# Patient Record
Sex: Female | Born: 1965 | Race: White | Hispanic: No
Health system: Southern US, Community
[De-identification: ages and names within clinical notes are randomized; demographics above are authoritative.]

## PROBLEM LIST (undated history)

## (undated) DIAGNOSIS — F32A Depression, unspecified: Secondary | ICD-10-CM

## (undated) DIAGNOSIS — K219 Gastro-esophageal reflux disease without esophagitis: Secondary | ICD-10-CM

## (undated) DIAGNOSIS — Z8619 Personal history of other infectious and parasitic diseases: Secondary | ICD-10-CM

## (undated) DIAGNOSIS — E559 Vitamin D deficiency, unspecified: Secondary | ICD-10-CM

## (undated) DIAGNOSIS — O24419 Gestational diabetes mellitus in pregnancy, unspecified control: Secondary | ICD-10-CM

## (undated) DIAGNOSIS — D649 Anemia, unspecified: Secondary | ICD-10-CM

## (undated) DIAGNOSIS — F329 Major depressive disorder, single episode, unspecified: Secondary | ICD-10-CM

## (undated) DIAGNOSIS — F419 Anxiety disorder, unspecified: Secondary | ICD-10-CM

## (undated) HISTORY — DX: Depression, unspecified: F41.9

## (undated) HISTORY — DX: Gastro-esophageal reflux disease without esophagitis: K21.9

## (undated) HISTORY — DX: Anemia, unspecified: D64.9

## (undated) HISTORY — DX: Major depressive disorder, single episode, unspecified: F32.9

## (undated) HISTORY — DX: Vitamin D deficiency, unspecified: E55.9

## (undated) HISTORY — DX: Personal history of other infectious and parasitic diseases: Z86.19

## (undated) HISTORY — PX: HAND SURGERY: SHX662

## (undated) HISTORY — DX: Depression, unspecified: F32.A

## (undated) HISTORY — DX: Gestational diabetes mellitus in pregnancy, unspecified control: O24.419

---

## 1990-09-28 HISTORY — PX: GYNECOLOGIC CRYOSURGERY: SHX857

## 1994-11-27 HISTORY — PX: CHOLECYSTECTOMY: SHX55

## 1998-09-28 HISTORY — PX: PELVIC LAPAROSCOPY: SHX162

## 1998-11-04 ENCOUNTER — Ambulatory Visit (HOSPITAL_COMMUNITY): Admission: RE | Admit: 1998-11-04 | Discharge: 1998-11-04 | Payer: Self-pay | Admitting: Obstetrics and Gynecology

## 1999-09-22 ENCOUNTER — Emergency Department (HOSPITAL_COMMUNITY): Admission: EM | Admit: 1999-09-22 | Discharge: 1999-09-22 | Payer: Self-pay | Admitting: Emergency Medicine

## 1999-09-30 ENCOUNTER — Other Ambulatory Visit: Admission: RE | Admit: 1999-09-30 | Discharge: 1999-09-30 | Payer: Self-pay | Admitting: Obstetrics and Gynecology

## 2000-10-06 ENCOUNTER — Other Ambulatory Visit: Admission: RE | Admit: 2000-10-06 | Discharge: 2000-10-06 | Payer: Self-pay | Admitting: Obstetrics and Gynecology

## 2001-08-24 ENCOUNTER — Other Ambulatory Visit: Admission: RE | Admit: 2001-08-24 | Discharge: 2001-08-24 | Payer: Self-pay | Admitting: Gynecology

## 2002-01-06 ENCOUNTER — Encounter: Admission: RE | Admit: 2002-01-06 | Discharge: 2002-01-30 | Payer: Self-pay | Admitting: Gynecology

## 2002-03-29 ENCOUNTER — Inpatient Hospital Stay (HOSPITAL_COMMUNITY): Admission: AD | Admit: 2002-03-29 | Discharge: 2002-03-31 | Payer: Self-pay | Admitting: Gynecology

## 2002-05-08 ENCOUNTER — Other Ambulatory Visit: Admission: RE | Admit: 2002-05-08 | Discharge: 2002-05-08 | Payer: Self-pay | Admitting: Gynecology

## 2002-09-28 HISTORY — PX: TUBAL LIGATION: SHX77

## 2002-12-27 ENCOUNTER — Other Ambulatory Visit: Admission: RE | Admit: 2002-12-27 | Discharge: 2002-12-27 | Payer: Self-pay | Admitting: Gynecology

## 2003-03-27 ENCOUNTER — Encounter: Admission: RE | Admit: 2003-03-27 | Discharge: 2003-06-25 | Payer: Self-pay | Admitting: Gynecology

## 2003-07-12 ENCOUNTER — Inpatient Hospital Stay (HOSPITAL_COMMUNITY): Admission: AD | Admit: 2003-07-12 | Discharge: 2003-07-14 | Payer: Self-pay | Admitting: Gynecology

## 2003-07-13 ENCOUNTER — Encounter (INDEPENDENT_AMBULATORY_CARE_PROVIDER_SITE_OTHER): Payer: Self-pay | Admitting: *Deleted

## 2003-08-22 ENCOUNTER — Encounter: Admission: RE | Admit: 2003-08-22 | Discharge: 2003-08-22 | Payer: Self-pay | Admitting: Gynecology

## 2003-08-22 ENCOUNTER — Other Ambulatory Visit: Admission: RE | Admit: 2003-08-22 | Discharge: 2003-08-22 | Payer: Self-pay | Admitting: Gynecology

## 2003-09-06 ENCOUNTER — Encounter: Admission: RE | Admit: 2003-09-06 | Discharge: 2003-09-06 | Payer: Self-pay | Admitting: Neurology

## 2003-09-19 ENCOUNTER — Encounter: Admission: RE | Admit: 2003-09-19 | Discharge: 2003-09-19 | Payer: Self-pay | Admitting: Neurology

## 2003-09-22 ENCOUNTER — Emergency Department (HOSPITAL_COMMUNITY): Admission: EM | Admit: 2003-09-22 | Discharge: 2003-09-22 | Payer: Self-pay | Admitting: Emergency Medicine

## 2004-02-04 ENCOUNTER — Ambulatory Visit (HOSPITAL_COMMUNITY): Admission: RE | Admit: 2004-02-04 | Discharge: 2004-02-04 | Payer: Self-pay | Admitting: Neurology

## 2004-02-11 ENCOUNTER — Encounter: Admission: RE | Admit: 2004-02-11 | Discharge: 2004-02-11 | Payer: Self-pay | Admitting: Neurology

## 2004-09-03 ENCOUNTER — Other Ambulatory Visit: Admission: RE | Admit: 2004-09-03 | Discharge: 2004-09-03 | Payer: Self-pay | Admitting: Gynecology

## 2004-10-01 ENCOUNTER — Ambulatory Visit (HOSPITAL_COMMUNITY): Admission: RE | Admit: 2004-10-01 | Discharge: 2004-10-01 | Payer: Self-pay | Admitting: Gynecology

## 2004-12-22 ENCOUNTER — Encounter: Admission: RE | Admit: 2004-12-22 | Discharge: 2004-12-22 | Payer: Self-pay | Admitting: Family Medicine

## 2005-02-15 ENCOUNTER — Emergency Department (HOSPITAL_COMMUNITY): Admission: EM | Admit: 2005-02-15 | Discharge: 2005-02-15 | Payer: Self-pay | Admitting: Family Medicine

## 2005-06-23 ENCOUNTER — Ambulatory Visit (HOSPITAL_BASED_OUTPATIENT_CLINIC_OR_DEPARTMENT_OTHER): Admission: RE | Admit: 2005-06-23 | Discharge: 2005-06-23 | Payer: Self-pay | Admitting: Orthopedic Surgery

## 2005-06-23 ENCOUNTER — Ambulatory Visit (HOSPITAL_COMMUNITY): Admission: RE | Admit: 2005-06-23 | Discharge: 2005-06-23 | Payer: Self-pay | Admitting: Orthopedic Surgery

## 2005-09-04 ENCOUNTER — Other Ambulatory Visit: Admission: RE | Admit: 2005-09-04 | Discharge: 2005-09-04 | Payer: Self-pay | Admitting: Gynecology

## 2005-10-09 ENCOUNTER — Ambulatory Visit (HOSPITAL_COMMUNITY): Admission: RE | Admit: 2005-10-09 | Discharge: 2005-10-09 | Payer: Self-pay | Admitting: Gynecology

## 2006-01-08 IMAGING — CR DG FOREARM 2V*L*
2 series · 2 of 2 positions shown · non-contrast
Comparison: none

CLINICAL DATA: 38 year old who fell.
 TWO VIEW LEFT FOREARM:
 There is no evidence of fracture or dislocation.  No other significant bone or soft tissue abnormalities are identified.

[view not recorded (1 of 2)]
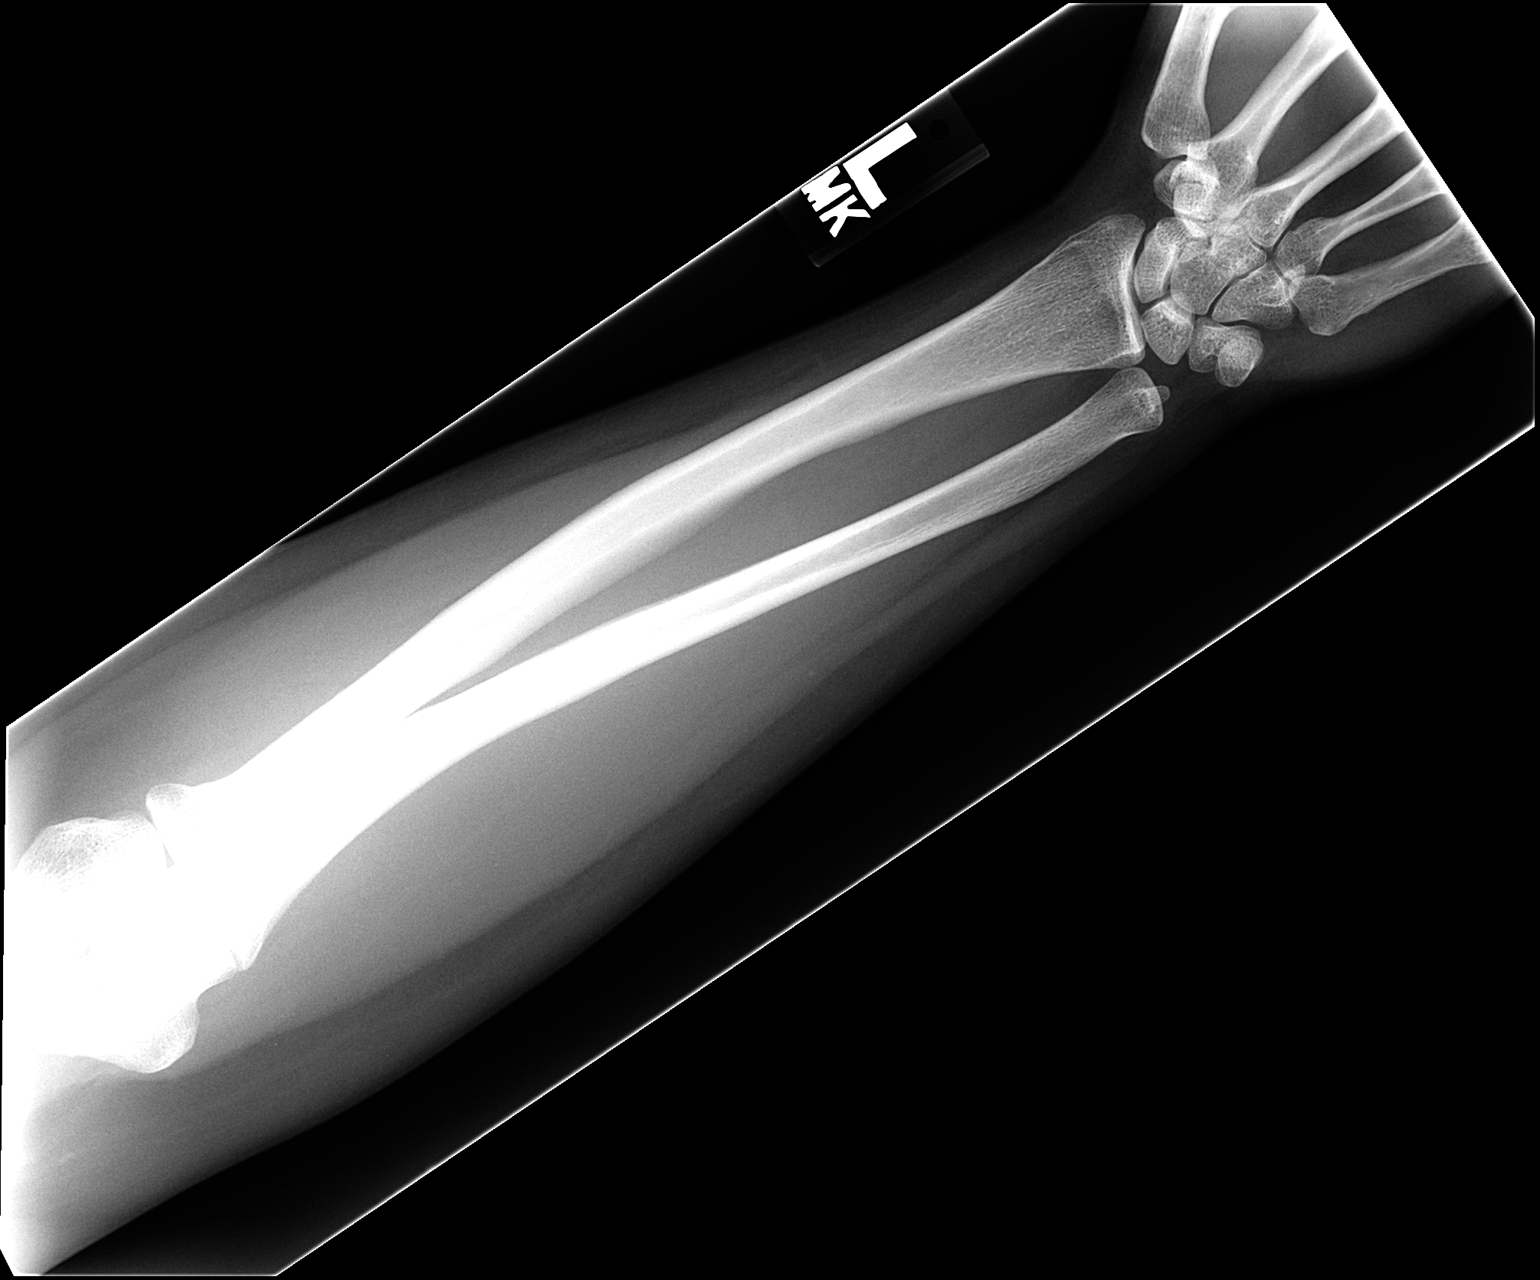

[view not recorded (2 of 2)]
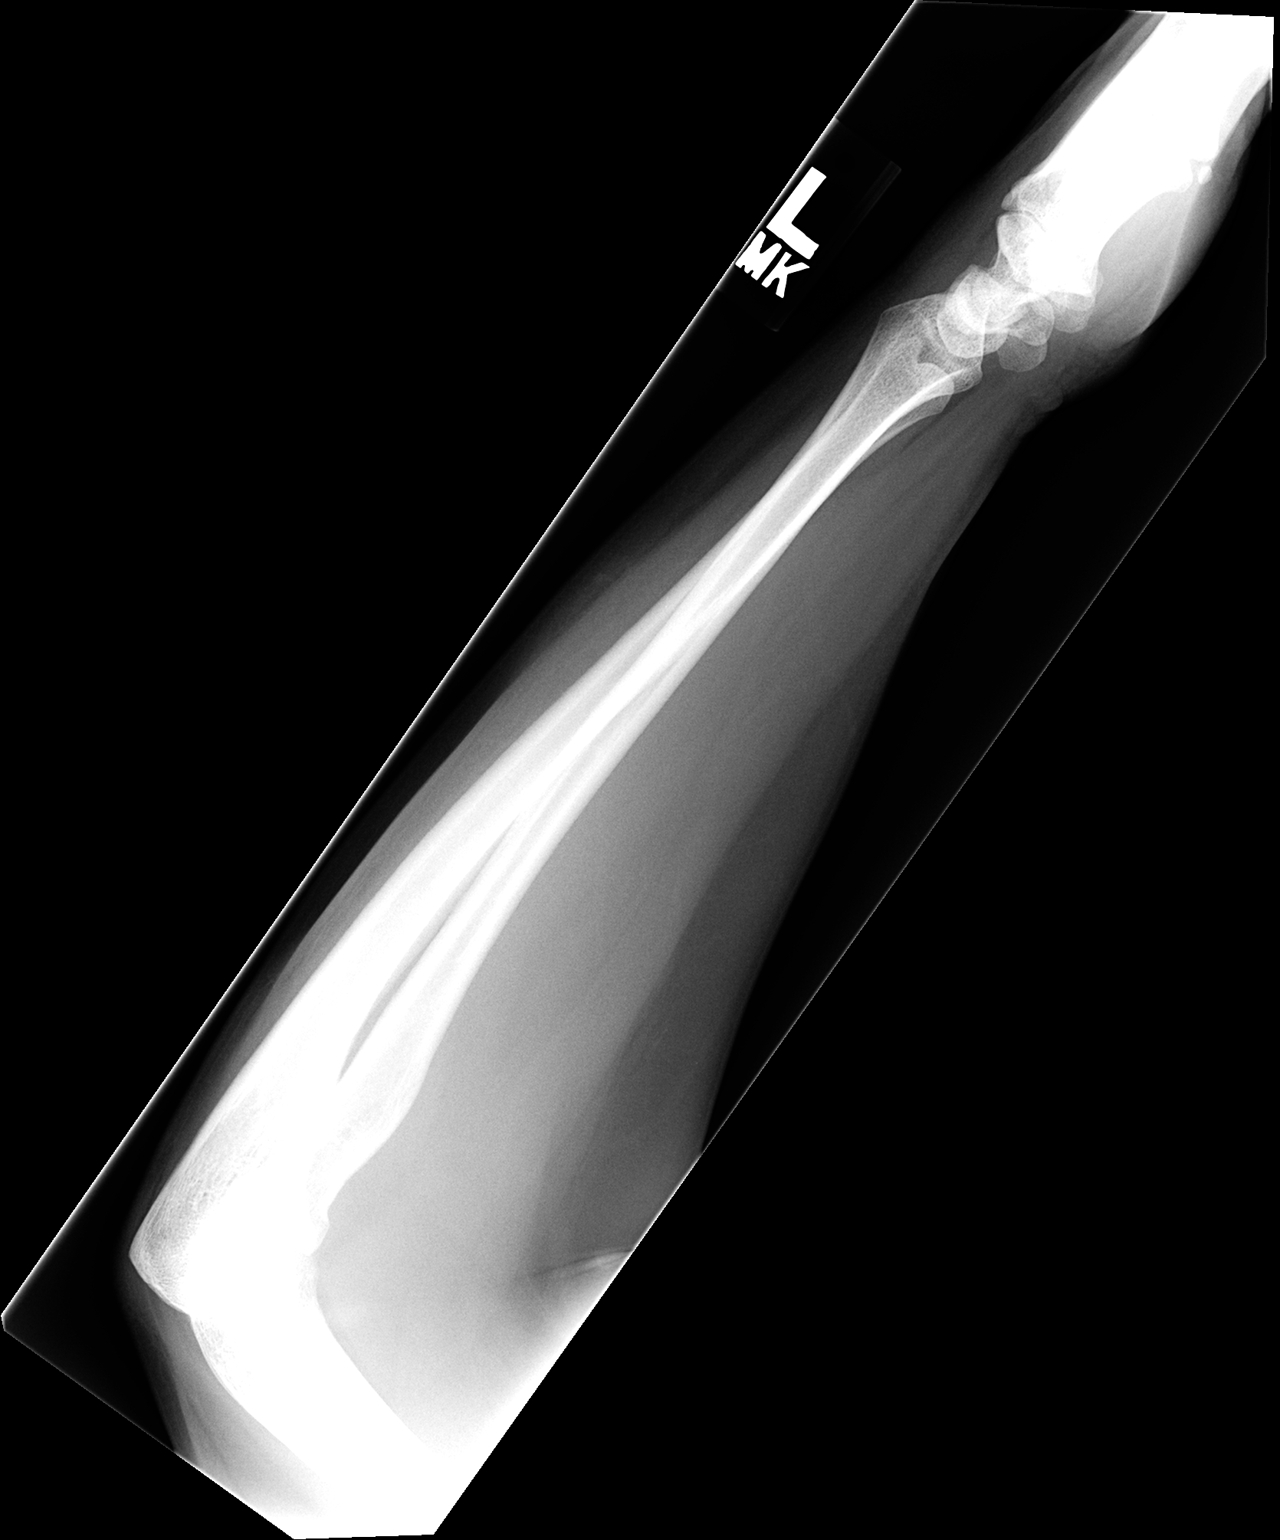

[2 of 2 positions shown; findings below may reference images not displayed]

IMPRESSION: Normal study.

## 2006-02-08 ENCOUNTER — Ambulatory Visit: Payer: Self-pay | Admitting: Internal Medicine

## 2006-09-08 ENCOUNTER — Other Ambulatory Visit: Admission: RE | Admit: 2006-09-08 | Discharge: 2006-09-08 | Payer: Self-pay | Admitting: Gynecology

## 2006-10-11 ENCOUNTER — Ambulatory Visit (HOSPITAL_COMMUNITY): Admission: RE | Admit: 2006-10-11 | Discharge: 2006-10-11 | Payer: Self-pay | Admitting: Gynecology

## 2007-10-13 ENCOUNTER — Ambulatory Visit (HOSPITAL_COMMUNITY): Admission: RE | Admit: 2007-10-13 | Discharge: 2007-10-13 | Payer: Self-pay | Admitting: Gynecology

## 2007-11-08 ENCOUNTER — Other Ambulatory Visit: Admission: RE | Admit: 2007-11-08 | Discharge: 2007-11-08 | Payer: Self-pay | Admitting: Gynecology

## 2007-11-16 DIAGNOSIS — Z8619 Personal history of other infectious and parasitic diseases: Secondary | ICD-10-CM

## 2007-11-16 HISTORY — DX: Personal history of other infectious and parasitic diseases: Z86.19

## 2008-02-27 ENCOUNTER — Other Ambulatory Visit: Admission: RE | Admit: 2008-02-27 | Discharge: 2008-02-27 | Payer: Self-pay | Admitting: Gynecology

## 2008-07-09 ENCOUNTER — Telehealth: Payer: Self-pay | Admitting: Internal Medicine

## 2008-07-09 DIAGNOSIS — R110 Nausea: Secondary | ICD-10-CM | POA: Insufficient documentation

## 2008-07-09 DIAGNOSIS — R1013 Epigastric pain: Secondary | ICD-10-CM | POA: Insufficient documentation

## 2008-07-09 DIAGNOSIS — K648 Other hemorrhoids: Secondary | ICD-10-CM | POA: Insufficient documentation

## 2008-07-09 DIAGNOSIS — K921 Melena: Secondary | ICD-10-CM | POA: Insufficient documentation

## 2008-07-10 ENCOUNTER — Ambulatory Visit: Payer: Self-pay | Admitting: Internal Medicine

## 2008-07-10 LAB — CONVERTED CEMR LAB: H Pylori IgG: NEGATIVE

## 2008-10-15 ENCOUNTER — Ambulatory Visit (HOSPITAL_COMMUNITY): Admission: RE | Admit: 2008-10-15 | Discharge: 2008-10-15 | Payer: Self-pay | Admitting: Gynecology

## 2008-10-18 ENCOUNTER — Encounter: Admission: RE | Admit: 2008-10-18 | Discharge: 2008-10-18 | Payer: Self-pay | Admitting: Gynecology

## 2009-01-01 ENCOUNTER — Other Ambulatory Visit: Admission: RE | Admit: 2009-01-01 | Discharge: 2009-01-01 | Payer: Self-pay | Admitting: Gynecology

## 2009-01-01 ENCOUNTER — Ambulatory Visit: Payer: Self-pay | Admitting: Gynecology

## 2009-01-01 ENCOUNTER — Encounter: Payer: Self-pay | Admitting: Gynecology

## 2009-03-07 ENCOUNTER — Encounter (INDEPENDENT_AMBULATORY_CARE_PROVIDER_SITE_OTHER): Payer: Self-pay | Admitting: *Deleted

## 2009-03-07 ENCOUNTER — Ambulatory Visit: Payer: Self-pay | Admitting: Gynecology

## 2009-03-07 ENCOUNTER — Encounter: Payer: Self-pay | Admitting: Internal Medicine

## 2009-03-08 ENCOUNTER — Encounter (INDEPENDENT_AMBULATORY_CARE_PROVIDER_SITE_OTHER): Payer: Self-pay | Admitting: *Deleted

## 2009-03-11 ENCOUNTER — Telehealth: Payer: Self-pay | Admitting: Internal Medicine

## 2009-03-15 ENCOUNTER — Ambulatory Visit: Payer: Self-pay | Admitting: Gastroenterology

## 2009-03-15 ENCOUNTER — Encounter: Payer: Self-pay | Admitting: Nurse Practitioner

## 2009-03-15 DIAGNOSIS — R6881 Early satiety: Secondary | ICD-10-CM | POA: Insufficient documentation

## 2009-03-15 LAB — CONVERTED CEMR LAB
A-1 Antitrypsin, Ser: 146 mg/dL (ref 83–200)
Anti Nuclear Antibody(ANA): NEGATIVE
Ceruloplasmin: 42 mg/dL (ref 21–63)
Ferritin: 21.7 ng/mL (ref 10.0–291.0)
HCV Ab: NEGATIVE
Hepatitis B Surface Ag: NEGATIVE
Iron: 63 ug/dL (ref 42–145)
Saturation Ratios: 14.2 % — ABNORMAL LOW (ref 20.0–50.0)
Transferrin: 317.1 mg/dL (ref 212.0–360.0)

## 2009-03-17 DIAGNOSIS — R935 Abnormal findings on diagnostic imaging of other abdominal regions, including retroperitoneum: Secondary | ICD-10-CM | POA: Insufficient documentation

## 2009-04-08 ENCOUNTER — Ambulatory Visit: Payer: Self-pay | Admitting: Internal Medicine

## 2009-04-08 ENCOUNTER — Encounter: Payer: Self-pay | Admitting: Internal Medicine

## 2009-04-08 DIAGNOSIS — K219 Gastro-esophageal reflux disease without esophagitis: Secondary | ICD-10-CM | POA: Insufficient documentation

## 2009-04-09 ENCOUNTER — Telehealth: Payer: Self-pay | Admitting: Internal Medicine

## 2009-04-10 ENCOUNTER — Ambulatory Visit: Payer: Self-pay | Admitting: Internal Medicine

## 2009-04-10 LAB — CONVERTED CEMR LAB: UREASE: NEGATIVE

## 2010-06-28 ENCOUNTER — Emergency Department (HOSPITAL_BASED_OUTPATIENT_CLINIC_OR_DEPARTMENT_OTHER): Admission: EM | Admit: 2010-06-28 | Discharge: 2010-06-28 | Payer: Self-pay | Admitting: Emergency Medicine

## 2010-06-29 ENCOUNTER — Ambulatory Visit: Payer: Self-pay | Admitting: Vascular Surgery

## 2010-06-29 ENCOUNTER — Ambulatory Visit (HOSPITAL_COMMUNITY): Admission: RE | Admit: 2010-06-29 | Discharge: 2010-06-29 | Payer: Self-pay | Admitting: Emergency Medicine

## 2010-06-29 ENCOUNTER — Encounter (INDEPENDENT_AMBULATORY_CARE_PROVIDER_SITE_OTHER): Payer: Self-pay | Admitting: Emergency Medicine

## 2010-10-18 ENCOUNTER — Encounter: Payer: Self-pay | Admitting: Gynecology

## 2010-10-19 ENCOUNTER — Encounter: Payer: Self-pay | Admitting: Gynecology

## 2010-10-28 NOTE — Assessment & Plan Note (Signed)
  Krystal Barber MR#:  [id number] Page #   NAME:  Krystal Barber, Krystal Barber  OFFICE NO:  [id number]  DATE:  02/08/06  DOB:  June 25, 2066  CHIEF COMPLAINT:  The patient is a very nice 45 year old white female referred by Dr. Farrel Gobble for intermittent rectal bleeding.  The patient has had about 3 episodes of bright red blood per rectum in the last 7 months.  There is no associated abdominal pain or rectal pain.  She has had some symptoms of gastroesophageal reflux for which she takes Tums with good results.  The patient has complained of postprandial diarrhea since her cholecystectomy.  She often has to run to the bathroom shortly after finishing a meal.  She had hemorrhoids with her last pregnancy.  There is no family history of colon cancer or colitis.  Her weight has increased steadily about 20 pounds.   MEDICATIONS:  Ibuprofen on a p.r.n. basis.  PAST MEDICAL HISTORY:  Gestational diabetes, cholecystectomy, tubal ligation.  FAMILY HISTORY:  Negative for colon cancer.  Mother has diabetes.  SOCIAL HISTORY:  Married with 3 children.  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  The patient does not smoke and does not drink.  REVIEW OF SYSTEMS:  Positive for weight gain, eyeglasses, night sweats, leakage of urine.  PHYSICAL EXAMINATION:  VITAL SIGNS:  Blood pressure 124/70.  Pulse 78.  Weight 213 pounds.  GENERAL:  The patient was mildly overweight.  HEENT:  The sclerae were not icteric.  Oral cavity was normal.  NECK:  Supple without adenopathy.  LUNGS:  Clear to auscultation.  COR:  With normal S1, normal S2.  ABDOMEN:  Soft with normoactive bowel sounds.  Nontender.  No masses.  RECTAL:  Rectal and anoscopic exam shows normal perianal area.  Rectal tone was normal.  There were 3 internal hemorrhoids, which were erythematous and hyperemic.  No active bleeding.  No thrombosis.  Stool was Hemoccult negative.  IMPRESSION:   1.  Symptomatic first-grade hemorrhoids causing intermittent bleeding in  a 45 year old patient with no family history of colon cancer.   2.  Postprandial diarrhea, most likely post cholecystectomy diarrhea caused by bile overflow.  Also possibly due to irritable bowel syndrome.  PLAN:   1.  Booklet on hemorrhoids. 2.  Anusol HC suppositories q.h.s. 3.  High-fiber diet. 4.  Robinul Forte 2 mg p.o. b.i.d. p.r.n. urgency.   5.  If the bleeding continues over the next several months, I would consider colonoscopic exam.      Hedwig Morton. Juanda Chance, M.D.  [id number] cc:  Dr. Douglass Rivers; Dr. Manus Gunning  D:  02/08/06; T:  ; Job Mindy.Ide                    ]

## 2010-10-28 NOTE — Miscellaneous (Signed)
Summary: Orders Update- Clotest  Clinical Lists Changes  Problems: Added new problem of GERD (ICD-530.81) Orders: Added new Test order of TLB-H Pylori Screen Gastric Biopsy (83013-CLOTEST) - Signed

## 2010-10-28 NOTE — Assessment & Plan Note (Signed)
Summary: RECTAL BLEEDING/EPIGASTRIC PAIN    F/U FROM TRIAGE ON 10-12-0...   History of Present Illness Visit Type: self referral Primary GI MD: [name] MD Primary Provider: Starr Sinclair Chief Complaint: follow up rectal bleed friday sat. stopped for now History of Present Illness:   45 year old white female with  intermittent rectal bleeding and epigastric discomfort of 6 weeks duration. The rectal bleeding was evaluated in the past and was attributed  to hemorrhoids. She is more worried about the upper  abdominal discomfort which is localized anteriorly ,is worse postprandially. and sometimes bothers her at night. She blames it on the type of food she eats. 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 Patient had  laparoscopic cholecystectomy many years ago causing bile overflow diarrhea. She has occasional nausea. She was started on Prilosec 20 mg a day yesterday with some improvement in today's symptoms   GI Review of Systems    Reports abdominal pain and  bloating.     Location of  Abdominal pain: upper abdomen.    Denies acid reflux, belching, chest pain, dysphagia with liquids, dysphagia with solids, heartburn, loss of appetite, nausea, vomiting, vomiting blood, weight loss, and  weight gain.      Reports diarrhea, heme positive stool, hemorrhoids, and  irritable bowel syndrome.     Denies anal fissure, black tarry stools, change in bowel habit, constipation, diverticulosis, fecal incontinence, jaundice, light color stool, liver problems, rectal bleeding, and  rectal pain.     Updated Prior Medication List: ANUSOL-HC 2.5 %  CREA (HYDROCORTISONE) Put one in rectum twice daily for 5 days. PRILOSEC 20 MG CPDR (OMEPRAZOLE) 1 tab by mouth qd  Current Allergies (reviewed today): ! SULFA ! Robyne Peers  Past Medical History:    Reviewed history from 07/09/2008 and no changes  required:       Current Problems:        INTERNAL HEMORRHOIDS (ICD-455.0)       EPIGASTRIC PAIN (ICD-789.06)       BLOOD IN STOOL (ICD-578.1)       NAUSEA (ICD-787.02)         Past Surgical History:    Reviewed history from 07/09/2008 and no changes required:       cholecystectomy       tubal ligation       Left hand surgery       cyst removal   Family History:    Reviewed history from 07/09/2008 and no changes required:       No FH of Colon Cancer:       Family History of Diabetes: mother  Social History:    Reviewed history from 07/09/2008 and no changes required:       Alcohol Use - no       Illicit Drug Use - no       Occupation: claims rep       Daily Caffeine Use       Patient does not get regular exercise.    Risk Factors:  Drug use:  no Exercise:  no   Review of Systems       The patient complains of anemia, cough, fever, itching, night sweats, and urine leakage.     Vital Signs:  Patient Profile:   45 Years Old Female Height:     64 inches Weight:      212 pounds BMI:     36.52  BSA:     2.01 Pulse rate:   64 / minute Pulse rhythm:   regular BP sitting:   102 / 58  (left arm)  Vitals Entered By: Paulene Floor, RN (July 10, 2008 2:23 PM)                  Physical Exam  General:     overweight, alert and oriented Neck:     Supple; no masses or thyromegaly. Lungs:     Clear throughout to auscultation. Heart:     Regular rate and rhythm; no murmurs, rubs,  or bruits. Abdomen:     obese abdomen , normoactive bowel sounds, tenderness across the upper abdomen, lower abdomen unremarkable, liver edge at costal margin Rectal:     normal rectal tone stool is soft Hemoccult-negative. No external hemorrhoids Extremities:     No clubbing, cyanosis, edema or deformities noted.    Impression & Recommendations:  Problem # 1:  BLOOD IN STOOL (ICD-578.1) internal hemorrhoids with intermittent rectal bleeding. Continue Anusol-HC suppositories as  prescribed yesterday.  Problem # 2:  EPIGASTRIC PAIN (ICD-789.06) epigastric pain rule out peptic ulcer disease rule out gastritis rule out functional GI problem, rule out H. pylori gastropathy or  bile gastritis. Patient is to continue on her Prilosec 20 mg daily. I have asked her to consider upper endoscopy but she would like to try  Prilosec for 4 weeks and then decide .revealed recheck her H. pylori antibody today Orders: TLB-H. Pylori Abs(Helicobacter Pylori) (86677-HELICO)    Patient Instructions: 1)  H. pylori antibody 2)  Prilosec 20 mg p.o. q.d. 3)  Anusol-HC suppository q.h.s. 4)  Consider upper endoscopy if no improvement in 4 weeks 5)  Copy Sent To:Dr R.Ehinger    ]

## 2010-10-28 NOTE — Assessment & Plan Note (Signed)
Summary: EPIGASTRIC PAIN,NAUSEA,BLOATING,EARLY SATIETY     Krystal   History of Present Illness Visit Type: follow up Primary GI MD: [name] MD Primary Provider: Starr Sinclair Requesting Provider: n/a Chief Complaint: Epigastric pain with bloating and nausea x 2 months and its getting worse. History of Present Illness:   [name] is worked in for two month history of post-prandial  epigastric pain and nausea. Saw Dr. Juanda Chance in Oct. 2009 with epigastric pain. Patient wanted to try Prilosec, she declined EGD. States this epigastric pain is different than before. It does not radiate, it is a dull ache with burning. Pain occurs 3-4 times a week after meals, lasts 1-2 hours then spontaneously resolves after lying down. BMs okay, alternates between normal and loose. No rectal bleedings. No significant weight loss. Gallbladder removed 1996.   Patient tells me she had a physical in April and told she was anemic.     GI Review of Systems    Reports bloating and  nausea.     Location of  Abdominal pain: upper abdomen.    Denies abdominal pain, acid reflux, belching, chest pain, dysphagia with liquids, dysphagia with solids, heartburn, loss of appetite, vomiting, vomiting blood, weight loss, and  weight gain.        Denies anal fissure, black tarry stools, change in bowel habit, constipation, diarrhea, diverticulosis, fecal incontinence, heme positive stool, hemorrhoids, irritable bowel syndrome, jaundice, light color stool, liver problems, rectal bleeding, and  rectal pain.   Current Medications (verified): 1)  Prilosec 20 Mg Cpdr (Omeprazole) .Marland Kitchen.. 1 Tab By Mouth Once Daily As Needed For Heartburn  Allergies (verified): 1)  ! Sulfa 2)  ! Tylenol  Past History:  Past Medical History: INTERNAL HEMORRHOIDS (ICD-455.0) GERD  Past Surgical History: cholecystectomy tubal ligation Left hand surgery  Family History: Reviewed history from 07/09/2008 and no changes required. No FH  of Colon Cancer: Family History of Diabetes: mother  Social History: Alcohol Use - no Illicit Drug Use - no Occupation: claims rep Daily Caffeine Use Patient does not get regular exercise.  Patient is a former smoker: 30 yrs ago  Smoking Status:  quit  Review of Systems       The patient complains of headaches-new.  The patient denies allergy/sinus, anemia, anxiety-new, arthritis/joint pain, back pain, blood in urine, breast changes/lumps, change in vision, confusion, cough, coughing up blood, depression-new, fainting, fatigue, fever, hearing problems, heart murmur, heart rhythm changes, itching, menstrual pain, muscle pains/cramps, night sweats, nosebleeds, pregnancy symptoms, shortness of breath, skin rash, sleeping problems, sore throat, swelling of feet/legs, swollen lymph glands, thirst - excessive , urination - excessive , urination changes/pain, urine leakage, vision changes, and voice change.    Vital Signs:  Patient profile:   45 year old female Height:      64 inches Weight:      209.8 pounds BMI:     36.14 BSA:     2.00 Pulse rate:   69 / minute Pulse rhythm:   regular BP sitting:   106 / 72  (left arm) Cuff size:   regular  Vitals Entered By: Ok Anis CMA (March 15, 2009 2:04 PM)  Physical Exam  General:  Well developed, well nourished, no acute distress. Head:  Normocephalic and atraumatic. Eyes:  Conjunctiva pink, no icterus.  Mouth:  No oral lesions. Tongue moist.  Neck:  Supple; no masses. Lungs:  Clear throughout to auscultation. Heart:  Regular rate and rhythm; no murmurs, rubs,  or bruits. Abdomen:  Abdomen  soft, obese, nontender, nondistended. No obvious masses. Cannot appreciate any organomegaly secondary to body habitus.  No obvious hernias. Normal bowel sounds.  Rectal:  No internal masses felt. No stool in vault. Gloved finger negative.  Msk:  Symmetrical with no gross deformities. Normal posture. Extremities:  No palmar erythema, no  edema.  Neurologic:  Alert and  oriented x4;  grossly normal neurologically. Skin:  Intact without significant lesions or rashes. Cervical Nodes:  No significant cervical adenopathy. Psych:  Alert and cooperative. Normal mood and affect.   Impression & Recommendations:  Problem # 1:  EPIGASTRIC PAIN (ICD-789.06) Assessment Deteriorated Associated with nausea, early satiety. Rule out PUD, neoplasm. U/S positive for hepatosplenomegaly which could be contributing to symptoms. See #2. The patient will be scheduled for an EGD with biopsies/ esophageal dilation ( if indicated).  The risks and benefits of the procedure, as well as alternatives were discussed with the patient and she agrees to proceed.  Problem # 2:  NONSPEC ABN FINDNG RAD & OTH EXAM ABDOMINAL AREA (ICD-793.6) Assessment: New U/S reveals enlarged spleen, hepatomegaly (20cm) with fatty infiltration.  No previous studies to compare. LFTs and platelets are normal. Can evaluate for signs of portal hypertension at time of EGD. Though U/S suggests fatty liver disease, need to other possible causes of liver disease such as viral hepatitis, autoimmune disease, hemochromatosis, Wilson's Disease, etc..Marland KitchenPatient will follow up with Dr. Juanda Chance after EGD.   Other Orders: TLB-IBC Pnl (Iron/FE;Transferrin) (83550-IBC) TLB-Ferritin (82728-FER) EGD (EGD) T-Alpha-1-Antitrypsin Tot (04540-98119) T-AMA (14782-95621) T-ANA [phone]) T-Anti SMA (62952-84132) T-Ceruloplasmin (44010-27253) T-Hepatitis B Surface Antigen (66440-34742) T-Hepatitis C Anti HCV (59563)  Patient Instructions: 1)  Please go to the lab on our basement level. 2)  We have scheduled you for an Endoscopy with Dr. [name]  3)  on 04-08-09 at [facility]. Please arrive on our 4th floor at 1PM. Directions for the Endoscopy provided. 4)  We have given you samples of Prilosec OTC to take 30 min prior to breakfast. 5)  The medication list was reviewed and reconciled.  All changed /  newly prescribed medications were explained.  A complete medication list was provided to the patient / caregiver. 6)  cc: Dr. Manus Gunning

## 2010-10-28 NOTE — Letter (Signed)
Summary: External Correspondence-[facility])  External Correspondence-[facility])   Imported By: Hortense Ramal CMA 03/27/2009 09:49:54  _____________________________________________________________________  External Attachment:    Type:   Image     Comment:   External Document

## 2010-10-28 NOTE — Progress Notes (Signed)
Summary: ON CALL - chest, arm, leg pain  Phone Note Call from Patient   Caller: Patient Call For: Dr. Juanda Chance Details for Reason: chest pain Summary of Call: 45 year old female who underwent diagnostic upper endoscopy yesterday with Dr. Juanda Chance for abdominal discomfort. She was found to have erosive esophagitis and was placed on b.i.d. Prilosec. She did okay after the procedure but call tonight complaining of left chest pain worse with breathing or movement as well as left arm pain and right leg pain. She wanted to know if this was "normal" after endoscopy and whether she should go to the hospital. I told her that this symptom complex would not be related to diagnostic upper endoscopy. I could not give her additional insight as to the cause. As this was a new problem for her, she was concerned, and described it as severe, I recommended that she go to the emergency room ASAP for evaluation. She agreed. Initial call taken by: Hilarie Fredrickson MD,  April 09, 2009 7:54 PM

## 2010-10-28 NOTE — Letter (Signed)
Summary: Patient Notice-Endo Biopsy Results  [facility]  [address] [place], [place] [address]   Phone: [phone]  Fax: [phone]        April 10, 2009 MRN: [id number]    Krystal Barber [address] [place], [place]  [address]    Dear [name],  I am pleased to inform you that the biopsies taken during your recent endoscopic examination did not show any evidence of cancer upon pathologic examination.The tissue from the esophagus showed mild inflammation.  Additional information/recommendations:  __No further action is needed at this time.  Please follow-up with      your primary care physician for your other healthcare needs.  __ Please call [phone] to schedule a return visit to review      your condition.  _x_ Continue with the treatment plan as outlined on the day of your      exam.     Please call us if you are having persistent problems or have questions about your condition that have not been fully answered at this time.  Sincerely,  Hart Carwin MD  This letter has been electronically signed by your physician.

## 2010-10-28 NOTE — Progress Notes (Signed)
Summary: TRIAGE-Rectal bleeding/epigastric pain  Phone Note Call from Patient Call back at [phone]   Caller: Patient Call For: Kanen Mottola Reason for Call: Talk to Nurse Details for Reason: TRIAGE Summary of Call: BLOOD IN STOOL and CRAMPING Initial call taken by: Guadlupe Spanish [facility],  July 09, 2008 8:09 AM  Follow-up for Phone Call        Pt. c/o BRB w/BM's on friday and saturday, no blood on Sunday. Rectal burning/itching. Epigastric pain for 2 monthes, severe episode last night. bloating and nausea after meals.  1) Anusol HC supp. 1 pr BID x 7-10 nights,sitz bath TID,Tucks pads to rectum TID,use baby wipes instead of toilet paper.  2) Prilosec OTC two times a day 3) Soft,bland diet. No spicy,greasy,fried foods. 4) App.t w/Dr.Keliyah Spillman on 07-10-08 at 2:15pm 5) Pt. instructed to call back as needed.   Follow-up by: Deborah Rachel LPN,  July 09, 2008 10:21 AM    New/Updated Medications: ANUSOL-HC 2.5 %  CREA (HYDROCORTISONE) Put one in rectum twice daily for 5 days.   Prescriptions: ANUSOL-HC 2.5 %  CREA (HYDROCORTISONE) Put one in rectum twice daily for 5 days.  #10 x 0   Entered by:   Deborah Rachel LPN   Authorized by:   Bonni Neuser M Neah Sporrer MD   Signed by:   Deborah Rachel LPN on 07/09/2008   Method used:   Electronically to        [facility]  [address]  [id number] ([facility])       [address] [place], [place]  [address]       Ph: [phone] or [phone]       Fax: [phone]   RxID:   [id number]

## 2010-12-11 LAB — BASIC METABOLIC PANEL
BUN: 12 mg/dL (ref 6–23)
CO2: 21 mEq/L (ref 19–32)
Calcium: 9 mg/dL (ref 8.4–10.5)
Chloride: 107 mEq/L (ref 96–112)
Creatinine, Ser: 0.8 mg/dL (ref 0.4–1.2)
GFR calc Af Amer: >60 mL/min (ref >60)
GFR calc non Af Amer: >60 mL/min (ref >60)
Glucose, Bld: 95 mg/dL (ref 70–99)
Potassium: 3.7 mEq/L (ref 3.5–5.1)
Sodium: 140 mEq/L (ref 135–145)

## 2010-12-11 LAB — URINALYSIS, ROUTINE W REFLEX MICROSCOPIC
Bilirubin Urine: NEGATIVE
Glucose, UA: NEGATIVE mg/dL
Hgb urine dipstick: NEGATIVE
Ketones, ur: NEGATIVE mg/dL
Nitrite: NEGATIVE
Protein, ur: NEGATIVE mg/dL
Specific Gravity, Urine: 1.017 (ref 1.005–1.030)
Urobilinogen, UA: 0.2 mg/dL (ref 0.0–1.0)
pH: 6 (ref 5.0–8.0)

## 2011-02-13 NOTE — H&P (Signed)
NAME:  Krystal Barber, UPLINGER                       ACCOUNT NO.:  [id number]   MEDICAL RECORD NO.:  [id number]                   PATIENT TYPE:  MAT   LOCATION:  [facility]                                 FACILITY:  [facility]   PHYSICIAN:  Ivor Costa. Farrel Gobble, M.D.              DATE OF BIRTH:  05/26/1966   DATE OF ADMISSION:  DATE OF DISCHARGE:                                HISTORY & PHYSICAL   CHIEF COMPLAINT:  Term with favorable cervix.   HISTORY OF PRESENT ILLNESS:  The patient is a 45 year old G6 P3-0-2-3 with  an LMP of October 12, 2002; estimated date of confinement of July 20, 2003; estimated gestational age of [redacted] weeks; with a history of A2 diabetes  that has been well controlled in the pregnancy.  The patient presents for an  elective induction secondary to moderate shoulder dystocia with her last  pregnancy.  The baby weighed 8 pounds 8 ounces at 40 weeks and is currently  doing well without any residual.  The patient has had an estimated fetal  weight three weeks ago which showed the baby to be in the 50th percentile at  6 pounds 2 ounces.  Of note, the patient has been followed with NSTs and  AFIs because of her gestational diabetes and has had borderline low fluid  throughout the monitoring period.  Her AFI was 9.7 today.  The patient has  no history of leakage.  Her NSTs have all been reactive.  The patient  reports good fetal movement, no vaginal bleeding, no contractions.  The  pregnancy is otherwise complicated by advanced maternal age for which the  patient declined an amniocentesis.  She had an AFP done that was within  normal limits instead.  Her pregnancy has also been complicated by A2  diabetes as mentioned above.   PRENATAL LABORATORY DATA:  She is O positive, antibody negative.  RPR  nonreactive.  Rubella immune.  Hepatitis B surface antigen nonreactive.  HIV  nonreactive.  GBS negative.   Please refer to the [facility].   PHYSICAL EXAMINATION:  VITAL SIGNS:  She is  afebrile.  Her weight is 210  pounds which is a 4.5 pound weight gain.  Blood pressure is 120/70.  GENERAL:  She is a well-appearing gravida in no acute distress.  HEART:  Regular rate.  LUNGS:  Clear to auscultation.  ABDOMEN:  Gravid, soft, and nontender.  PELVIC:  She is 2-3 cm, long, and 0 station.  EXTREMITIES:  Trace edema.   ASSESSMENT:  Favorable cervix at term with borderline low fluid and history  of previous shoulder dystocia.  The patient elects for induction in order to  minimize the risks of a recurrent shoulder dystocia.  She is aware however  that there is no guarantee against recurrence.  There is also no guarantee  for a vaginal delivery.  All questions were addressed.  She will present in  the morning of  July 12, 2003 for induction.                                               Ivor Costa. Farrel Gobble, M.D.    THL/MEDQ  D:  07/10/2003  T:  07/10/2003  Job:  [id number]

## 2011-02-13 NOTE — Op Note (Signed)
Krystal Barber, Krystal Barber             ACCOUNT NO.:  [id number]   MEDICAL RECORD NO.:  [id number]          PATIENT TYPE:  AMB   LOCATION:  DSC                          FACILITY:  [facility]   PHYSICIAN:  Katy Fitch. Sypher, M.D. DATE OF BIRTH:  July 22, 1966   DATE OF PROCEDURE:  DATE OF DISCHARGE:                                 OPERATIVE REPORT   PREOPERATIVE DIAGNOSIS:  Painful left dorsal wrist capsular ganglion.   POSTOP DIAGNOSIS:  Painful left dorsal wrist capsular ganglion with  complication of the origin of a ganglion on dorsal fibers of scapholunate  interosseous ligament.   OPERATION:  Debridement of dorsal ganglion left wrist.   OPERATING SURGEON:  Katy Fitch. Sypher, M.D.   ASSISTANT:  Molly Maduro Dasnoit PA-C.   ANESTHESIA:  General by LMA.   SUPERVISING ANESTHESIOLOGIST:  Quita Skye. Krista Blue, M.D.   INDICATIONS:  Krystal Barber is a 45 year old, right-hand-dominant woman  referred by the of [facility], specifically Dr. Bryan Lemma.  Ehinger, for evaluation and management of a mass in the dorsal aspect of the  left wrist. Clinical examination suggested a subretinacular ganglion. Plain  films of the wrist were unremarkable.   We recommended, for pain relief, excision of the dorsal ganglion.   After informed consent, she is brought to the operating room at this time.   Preoperatively she was advised that the removal of ganglions is a surgical  art. There are no scientific means of guaranteeing complete relief of the  ganglion.   Our plan is to debride the ganglion from its originating capsular tissues  and to inspect the scapholunate interosseous ligament.   After informed consent, she is brought to the operating room at this time.   PROCEDURE:  Krystal Barber is brought to the operating room and placed in  the supine position on the operating table.   Following the induction of general anesthesia by LMA, the left arm is  prepped with Betadine soap and solution, and  sterilely draped.   Following exsanguination of the left arm with an Esmarch bandage, and  arterial tourniquet, the proximal brachium is inflated to 220 mmHg.  The  procedure commenced with a short transverse incision directly over the apex  of the mass. The subcutaneous tissue was carefully divided taking care to  identify the extensor retinaculum. The distal fibers of the retinaculum were  split with a scalpel and scissors followed by careful circumferential  dissection exposing the ganglion 360-degrees around.   The ganglion was followed to the dorsal aspect the scapholunate interosseous  ligament, taking care to spare the main branches of the dorsal carpal arch.   The cyst was then removed piecemeal with a rongeur and its sinus tract  followed directly to the scapholunate ligament. The ligament was debrided to  normal-appearing fibers and electrocautery was used to thermally destroy  some of the superficial cells at the base of ganglion.   The wound was then irrigated, capsular tissues approximated, and the wound  repaired with intradermal 3-0 Prolene. There no apparent complications.   Ms. Rossmann tolerated the surgery and anesthesia well.Marland Kitchen She was transferred  to  recovery room with stable signs. She will be discharged with a  prescription for Dilaudid 2 mg 1 or 2 tablets p.o. q.4 h. p.r.n. pain, 24  tablets, without refill.      Katy Fitch Sypher, M.D.  Electronically Signed     RVS/MEDQ  D:  06/23/2005  T:  06/23/2005  Job:  [id number]   cc:   Bryan Lemma. Manus Gunning, M.D.  Fax: [phone]

## 2011-02-13 NOTE — Discharge Summary (Signed)
NAME:  Krystal Barber, Krystal Barber                       ACCOUNT NO.:  [id number]   MEDICAL RECORD NO.:  [id number]                   PATIENT TYPE:  INP   LOCATION:  9145                                 [facility]   PHYSICIAN:  Juan H. Lily Peer, M.D.             DATE OF BIRTH:  05-03-1966   DATE OF ADMISSION:  07/12/2003  DATE OF DISCHARGE:  07/14/2003                                 DISCHARGE SUMMARY   DISCHARGE DIAGNOSES:  1. Intrauterine pregnancy 39 weeks, delivered.  2. Gestational diabetes, insulin-dependent.  3. Status post spontaneous vaginal delivery.  4. Desired attempt at permanent sterilization.  5. Status post postpartum tubal ligation Pomeroy technique by [employer]     Lily Peer, M.D. on July 14, 2003.   HISTORY:  This is a 45 year old female gravida 6, para 3, aborta 2 with an  EDC of July 20, 2003.  Prenatal course had been complicated by 35 around  the time of birth.  The patient declined amniocentesis.  Also desired  attempt at permanent sterilization and developed gestational diabetes.  This  was insulin-dependent.  Also had borderline low amniotic fluid which was  followed and a history of prior shoulder dystocia.  The patient actually was  scheduled to be induced on July 12, 2003; however, presented with  spontaneous rupture of membranes.   HOSPITAL COURSE:  On July 12, 2003 patient was admitted at 39 weeks with  spontaneous rupture of membranes.  Successfully underwent spontaneous  vaginal delivery and on July 12, 2003 patient had a spontaneous vaginal  delivery of a female, Apgars 8 and 9, weight 8 pounds 1 ounces.  There was a  second degree perineal laceration which was repaired.  It was noted there  was some facial bruising.  Prophylactic suprapubic pressure was used  secondary to history of shoulder dystocia.  There were no complications.  Postpartum patient remained afebrile, voiding, stable condition.  On July 14, 2003 the patient did  undergo a postpartum tubal ligation Pomeroy  technique without complications by [employer] Lily Peer, M.D.  It was noted  that the infant did have a fractured clavicle, but did well.  On July 14, 2003 the patient was in satisfactory condition, was discharged to home and  given [facility] Gynecology postpartum instruction/postpartum booklet.   ACCESSORY CLINICAL FINDINGS:  Laboratories:  The patient is O+.  Rubella  immune.  On July 13, 2003 hemoglobin 10.3.   DISPOSITION:  The patient is discharged to home.  Informed to return to  office in two weeks for incision check.  If any problem prior to that time  to be seen in office.  Was given a prescription for Tylox p.r.n. pain.     Susa Loffler, P.A.                    Juan H. Lily Peer, M.D.    TSG/MEDQ  D:  08/03/2003  T:  08/03/2003  Job:  [id number]

## 2011-02-13 NOTE — Discharge Summary (Signed)
   NAME:  Krystal Barber, Krystal Barber                       ACCOUNT NO.:  [id number]   MEDICAL RECORD NO.:  [id number]                   PATIENT TYPE:  INP   LOCATION:  9145                                 [facility]   PHYSICIAN:  Juan H. Lily Peer, M.D.             DATE OF BIRTH:  1966-03-11   DATE OF ADMISSION:  03/29/2002  DATE OF DISCHARGE:  03/31/2002                                 DISCHARGE SUMMARY   DISCHARGE DIAGNOSIS:  Intrauterine pregnancy at term, spontaneous rupture of  membranes.   PROCEDURES:  Normal spontaneous vaginal delivery of a viable infant over  intact perineum with repair of a second-degree midline laceration.   HISTORY OF PRESENT ILLNESS:  The patient is a 45 year old gravida 5, para 2-  0-2-2 with LMP May 22, 2001 with [facility] of April 04, 2002.  Prenatal course  complicated by gestational diabetes requiring insulin management.   LABORATORY DATA:  Blood type O+, antibody screen negative.  RPR, HBsAg, HIV  nonreactive.  Rubella was nonimmune.   HOSPITAL COURSE AND TREATMENT:  The patient was admitted on March 29, 2002  with spontaneous rupture of membranes.  She did progress to complete  dilatation and delivered an Apgar 9/9 female infant over an intact perineum  with repair of second-degree laceration.  Birth weight was 8 pounds 8  ounces.   POSTPARTUM COURSE:  The patient remained afebrile and had no difficulty  voiding and was able to be discharged in satisfactory condition on her  second postpartum day.  CBC:  Hematocrit 31.3, hemoglobin 10.7, WBC 13.2,  platelets 194.   DISPOSITION:  Follow up in six weeks.  Continue prenatal vitamins and iron  and Motrin for pain.  Rubella vaccine prior to discharge.     Elwyn Lade . Hancock, N.P.                Gaetano Hawthorne. Lily Peer, M.D.    MKH/MEDQ  D:  04/27/2002  T:  05/03/2002  Job:  [id number]

## 2011-02-13 NOTE — Procedures (Signed)
DATE OF PROCEDURE:  Feb 04, 2004.   This is a routine EEG done with photic and hyperventilation.  The patient is  described as awake and drowsy.   CLINICAL HISTORY:  A 45 year old woman with atypical spells.  EEG is  performed for evaluation of possible seizure.   DESCRIPTION OF PROCEDURE:  The dominant rhythm of this tracing is a moderate  amplitude alpha rhythm of approximately 11 hertz which predominates  posteriorly, appears within normal asymmetry and attenuates with eye opening  and closing.  Abundant low amplitude facet activity is seen frontally and  centrally and appears without abnormal asymmetry.  No focal slowing is noted  and no epileptiform discharges are seen.  Drowsiness occurs naturally as  evidenced by fragmentation of the background and occasional attenuation of  rhythms into low amplitude theta range.  Sleep architecture is not otherwise  noted.  Photic stimulation produced bilateral symmetric driving responses  best seen at 11 hertz.  Hyperventilation produced mild slowing and build up  of the background without appearance of focal abnormalities.  Single channel  devoted to EKG revealed sinus rhythm throughout with rate of approximately  72 beats per minute.   CONCLUSION:  Normal study in the awake and lightly drowsy states.    Fenton Malling, M.D.   VHQ:IONG  D:  02/04/2004 20:34:48  T:  02/05/2004 07:10:10  Job #:  [id number]

## 2011-02-13 NOTE — Op Note (Signed)
NAME:  Krystal Barber, Krystal Barber                       ACCOUNT NO.:  [id number]   MEDICAL RECORD NO.:  [id number]                   PATIENT TYPE:  INP   LOCATION:  9145                                 [facility]   PHYSICIAN:  Juan H. Lily Peer, M.D.             DATE OF BIRTH:  1966-09-15   DATE OF PROCEDURE:  07/13/2003  DATE OF DISCHARGE:                                 OPERATIVE REPORT   PREOPERATIVE DIAGNOSES:  1. Status post normal spontaneous vaginal delivery July 12, 2003.  2. Request for elective permanent sterilization.   POSTOPERATIVE DIAGNOSES:  1. Status post normal spontaneous vaginal delivery July 12, 2003.  2. Request for elective permanent sterilization.   ANESTHESIA:  Epidural.   SURGEON:  Juan H. Lily Peer, M.D.   PROCEDURE:  Postpartum tubal ligation, Pomeroy technique.   INDICATION FOR OPERATION:  A 45 year old gravida 6, para 4, AB 2, status  post normal spontaneous vaginal delivery on October 14.  The patient was  requesting elective permanent sterilization.   DESCRIPTION OF OPERATION:  After the patient was adequately counseled, she  was taken to the operating room, where she was redosed through her epidural  catheter.  After adequate level was obtained, the abdomen was prepped and  draped in the usual sterile fashion.  A semilunar incision was made in the  infraumbilical region.  The incision was carried down through the skin and  subcutaneous tissue down to the fascia.  The fascia was incised and with the  use of two [employer] retractors for exposure, the peritoneal cavity was  entered.  The left tube was brought into view with the utilization of a vein  retractor.  The midportion was grasped with a Babcock clamp and was  completely identified to its distal portion to confirm that it was the left  fallopian tube, and the proximal one-third portion of the fallopian tube was  suture ligated with 3-0 Vicryl suture x2 and a 2 cm segment was excised  and  passed off the operative field.  The remaining stumps were Bovie cauterized.  A similar procedure was carried out on the contralateral side.  There was  good hemostasis throughout the procedure.  The fascia was reapproximated  with a running stitch of 0 Vicryl suture and the skin was reapproximated  with a subcuticular stitch of 3-0 plain catgut suture.  For postoperative  analgesia 0.25% Marcaine was infiltrated into the incision site.  The Steri-  Strips were also placed in the area.  The patient was transferred to  recovery room with stable vital signs, and she was given 30 mg IV for postop  pain relief.  Blood loss was minimal.  Fluid resuscitation consisted of 600  mL of lactated Ringer's.  Juan H. Lily Peer, M.D.    JHF/MEDQ  D:  07/14/2003  T:  07/14/2003  Job:  [id number]

## 2011-11-05 ENCOUNTER — Ambulatory Visit (INDEPENDENT_AMBULATORY_CARE_PROVIDER_SITE_OTHER): Payer: BC Managed Care – PPO | Admitting: Gynecology

## 2011-11-05 ENCOUNTER — Ambulatory Visit: Payer: Self-pay | Admitting: Gynecology

## 2011-11-05 ENCOUNTER — Ambulatory Visit (HOSPITAL_COMMUNITY)
Admission: RE | Admit: 2011-11-05 | Discharge: 2011-11-05 | Disposition: A | Discharge status: Home / Self Care | Payer: BC Managed Care – PPO | Source: Ambulatory Visit | Attending: Gynecology | Admitting: Gynecology

## 2011-11-05 ENCOUNTER — Encounter: Payer: Self-pay | Admitting: Gynecology

## 2011-11-05 ENCOUNTER — Other Ambulatory Visit: Payer: Self-pay | Admitting: Gynecology

## 2011-11-05 ENCOUNTER — Other Ambulatory Visit (HOSPITAL_COMMUNITY)
Admission: RE | Admit: 2011-11-05 | Discharge: 2011-11-05 | Disposition: A | Discharge status: Home / Self Care | Payer: BC Managed Care – PPO | Source: Ambulatory Visit | Attending: Gynecology | Admitting: Gynecology

## 2011-11-05 VITALS — BP 130/80 | Ht 63.25 in | Wt 219.0 lb

## 2011-11-05 DIAGNOSIS — Z01419 Encounter for gynecological examination (general) (routine) without abnormal findings: Secondary | ICD-10-CM

## 2011-11-05 DIAGNOSIS — F419 Anxiety disorder, unspecified: Secondary | ICD-10-CM

## 2011-11-05 DIAGNOSIS — R05 Cough: Secondary | ICD-10-CM

## 2011-11-05 DIAGNOSIS — R635 Abnormal weight gain: Secondary | ICD-10-CM

## 2011-11-05 DIAGNOSIS — F411 Generalized anxiety disorder: Secondary | ICD-10-CM

## 2011-11-05 DIAGNOSIS — Z1231 Encounter for screening mammogram for malignant neoplasm of breast: Secondary | ICD-10-CM

## 2011-11-05 DIAGNOSIS — G2581 Restless legs syndrome: Secondary | ICD-10-CM

## 2011-11-05 DIAGNOSIS — Z833 Family history of diabetes mellitus: Secondary | ICD-10-CM

## 2011-11-05 DIAGNOSIS — R059 Cough, unspecified: Secondary | ICD-10-CM | POA: Insufficient documentation

## 2011-11-05 LAB — CHOLESTEROL, TOTAL: Cholesterol: 121 mg/dL (ref 0–200)

## 2011-11-05 MED ORDER — CLONAZEPAM 0.5 MG PO TABS: 0.5000 mg | ORAL_TABLET | Freq: Two times a day (BID) | ORAL | Status: DC | PRN

## 2011-11-05 NOTE — Progress Notes (Signed)
Krystal Barber 10-17-1965 [id number]   History:    46 y.o.  for annual exam who is practically new patient to the practice can she has not been here in 3 years. Patient with a multitude of complaints. 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 Patient overweight although was weighing 219 pounds now weighing 213. She's had a previous tubal sterilization procedure her cycles are reported to be regular. One of her main complaints is she has much anxiety and wakes up during the night with sexual fantasies that sometimes she even acts out. She also has been complaining over a year of a nonproductive cough and had been treated several months ago at an urgent care but no chest x-ray has never been done. Patient is a nonsmoker. She's currently unemployed and at home and denies any exposure to anyone with TB. She also is been complaining of restless legs.   medical history,surgical history, family history and social history were all reviewed and documented in the EPIC chart.  Gynecologic History Patient's last menstrual period was 10/19/2011. Contraception: tubal ligation Last Pap:  3 years ago . Results were: normal Last mammogram:  3 years ago . Results were: normal  Obstetric History OB History    Grav Para Term Preterm Abortions TAB SAB Ect Mult Living   6 4 4  2  2   4      # Outc Date GA Lbr Len/2nd Wgt Sex Del Anes PTL Lv   1 TRM     M SVD  No Yes   2 TRM     M SVD  No Yes   3 TRM     M SVD  No Yes   4 TRM     M SVD  No Yes   5 SAB            6 SAB                ROS:  Was performed and pertinent positives and negatives are included in the history.  Exam: chaperone present  BP 130/80  Ht 5' 3.25" (1.607 m)  Wt 219 lb (99.338 kg)  BMI 38.49 kg/m2  LMP 10/19/2011  Body mass index is 38.49 kg/(m^2).  General appearance : Well developed well nourished female. No acute distress HEENT: Neck supple, trachea midline,  no carotid bruits, no thyroidmegaly Lungs: Clear to auscultation, no rhonchi or wheezes, or rib retractions  Heart: Regular rate and rhythm, no murmurs or gallops Breast:Examined in sitting and supine position were symmetrical in appearance, no palpable masses or tenderness,  no skin retraction, no nipple inversion, no nipple discharge, no skin discoloration, no axillary or supraclavicular lymphadenopathy Abdomen: no palpable masses or tenderness, no rebound or guarding Extremities: no edema or skin discoloration or tenderness  Pelvic:  Bartholin, Urethra, Skene Glands: Within normal limits             Vagina: No gross lesions or discharge  Cervix: No gross lesions or discharge  Uterus  Anteverted , normal size, shape and consistency, non-tender and mobile  Adnexa  Without masses or tenderness  Anus and perineum  normal   Rectovaginal  normal sphincter tone without palpated masses or tenderness             Hemoccult  not done      Assessment/Plan:  46 y.o. 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 Patient will be referred to a psychiatrist for further assessment and treatment. In the meantime she will be placed on Klonopin 0.5 mg 1 by mouth twice a day. As per her nonproductive cough she'll be sent to the hospital for a chest x-ray PA and lateral and then to the health department for a TB tine test. She will then be followed by a pulmonologist for further evaluation. The Klonopin will help her also for the restless leg syndrome. We'll check a CBC, TSH, hemoglobin A1c, cholesterol, urinalysis and Pap smear. Requisition was given for her to schedule her mammogram that is overdue. She was encouraged to continue her monthly self breast examinations. Literature information on weight reduction diet and exercise provided as well. All the above instructions were provided in written format for patient to comply.     [name] MD, 11:43 AM 11/05/2011

## 2011-11-05 NOTE — Patient Instructions (Addendum)
Patient information: High cholesterol (The Basics)  What is cholesterol? - Cholesterol is a substance that is found in the blood. Everyone has some. It is needed for good health. The problem is, people sometimes have too much cholesterol. Compared with people with normal cholesterol, people with high cholesterol have a higher risk of heart attacks, strokes, and other health problems. The higher your cholesterol, the higher your risk of these problems.  Are there different types of cholesterol? - Yes, there are a few different types. If you get a cholesterol test, you may hear your doctor or nurse talk about: Total cholesterol  LDL cholesterol - Some people call this the "bad" cholesterol. That's because having high LDL levels raises your risk of heart attacks, strokes, and other health problems.  HDL cholesterol - Some people call this the "good" cholesterol. That's because having high HDL levels lowers your risk of heart attacks, strokes, and other health problems.  Non-HDL cholesterol - Non-HDL cholesterol is your total cholesterol minus your HDL cholesterol.  Triglycerides - Triglycerides are not cholesterol. They are a type of fat. But they often get measured when cholesterol is measured. (Having high triglycerides also seems to increase the risk of heart attacks and strokes.)  What should my numbers be? - Ask your doctor or nurse what your numbers should be. Different people need different goals. (If you live outside the [place], see (table 1)). In general, people who do not already have heart disease should aim for: Total cholesterol below 200  LDL cholesterol below 130 - or much lower, if they are at risk of heart attacks or strokes  HDL cholesterol above 60  Non-HDL cholesterol below 160 - or lower, if they are at risk of heart attacks or strokes  Triglycerides below 150 Keep in mind, though, that many people who cannot meet these goals still have a low risk of  heart attacks and strokes. What should I do if my doctor tells me I have high cholesterol? - Ask your doctor what your overall risk of heart attacks and strokes is. High cholesterol, by itself, is not always a reason to worry. Having high cholesterol is just one of many things that can increase your risk of heart attacks and strokes. Other factors that increase your risk include:  Cigarette smoking  High blood pressure  Having a parent, sister, or brother who got heart disease at a young age (Young, in this case, means younger than 65 for men and younger than 48 for women.)  Being a man (Women are at risk, too, but men have a higher risk.)  Older age  If you are at high risk of heart attacks and strokes, having high cholesterol is a problem. On the other hand, if you have are at low risk, having high cholesterol may not mean much. Should I take medicine to lower cholesterol? - Not everyone who has high cholesterol needs medicines. Your doctor or nurse will decide if you need them based on your age, family history, and other health concerns.  You should probably take a cholesterol-lowering medicine called a statin if you: Already had a heart attack or stroke  Have known heart disease  Have diabetes  Have a condition called peripheral artery disease, which makes it painful to walk, and happens when the arteries in your legs get clogged with fatty deposits  Have an abdominal aortic aneurysm, which is a widening of the main artery in the belly  Most people with any of the conditions  listed above should take a statin no matter what their cholesterol level is. If your doctor or nurse puts you on a statin, stay on it. The medicine may not make you feel any different. But it can help prevent heart attacks, strokes, and death.  Can I lower my cholesterol without medicines? - Yes, you can lower your cholesterol some by:  Avoiding red meat, butter, fried foods, cheese, and other foods that have a lot of  saturated fat  Losing weight (if you are overweight)  Being more active Even if these steps do little to change your cholesterol, they can improve your health in many ways.   [facility] HMD11:31 AMTD@ Exercise to Lose Weight Exercise and a healthy diet may help you lose weight. Your doctor may suggest specific exercises. EXERCISE IDEAS AND TIPS  Choose low-cost things you enjoy doing, such as walking, bicycling, or exercising to workout videos.   Take stairs instead of the elevator.   Walk during your lunch break.   Park your car further away from work or school.   Go to a gym or an exercise class.   Start with 5 to 10 minutes of exercise each day. Build up to 30 minutes of exercise 4 to 6 days a week.   Wear shoes with good support and comfortable clothes.   Stretch before and after working out.   Work out until you breathe harder and your heart beats faster.   Drink extra water when you exercise.   Do not do so much that you hurt yourself, feel dizzy, or get very short of breath.  Exercises that burn about 150 calories:  Running 1  miles in 15 minutes.   Playing volleyball for 45 to 60 minutes.   Washing and waxing a car for 45 to 60 minutes.   Playing touch football for 45 minutes.   Walking 1  miles in 35 minutes.   Pushing a stroller 1  miles in 30 minutes.   Playing basketball for 30 minutes.   Raking leaves for 30 minutes.   Bicycling 5 miles in 30 minutes.   Walking 2 miles in 30 minutes.   Dancing for 30 minutes.   Shoveling snow for 15 minutes.   Swimming laps for 20 minutes.   Walking up stairs for 15 minutes.   Bicycling 4 miles in 15 minutes.   Gardening for 30 to 45 minutes.   Jumping rope for 15 minutes.   Washing windows or floors for 45 to 60 minutes.  Document Released: 10/17/2010 Document Revised: 05/27/2011 Document Reviewed: 10/17/2010 [facility] Patient Information 2012 [place], [place].   Remember to: 1. Go to [facility] for chest xray 2. Go to Health Dept. For TB Tine Test 3. Make appointment with Pulmonologist 4. Make appointment for Psychiatrist 5. Prescription at your pharmacy

## 2011-11-06 ENCOUNTER — Encounter: Payer: Self-pay | Admitting: Internal Medicine

## 2011-11-06 ENCOUNTER — Encounter: Payer: Self-pay | Admitting: *Deleted

## 2011-11-06 ENCOUNTER — Other Ambulatory Visit: Payer: Self-pay | Admitting: *Deleted

## 2011-11-06 DIAGNOSIS — D649 Anemia, unspecified: Secondary | ICD-10-CM

## 2011-11-06 LAB — CBC WITH DIFFERENTIAL/PLATELET
Basophils Absolute: 0 10*3/uL (ref 0.0–0.1)
Basophils Relative: 0 % (ref 0–1)
Eosinophils Absolute: 0.1 10*3/uL (ref 0.0–0.7)
Eosinophils Relative: 1 % (ref 0–5)
HCT: 37.4 % (ref 36.0–46.0)
Hemoglobin: 11.2 g/dL — ABNORMAL LOW (ref 12.0–15.0)
Lymphocytes Relative: 27 % (ref 12–46)
Lymphs Abs: 2.4 10*3/uL (ref 0.7–4.0)
MCH: 26.1 pg (ref 26.0–34.0)
MCHC: 29.9 g/dL — ABNORMAL LOW (ref 30.0–36.0)
MCV: 87.2 fL (ref 78.0–100.0)
Monocytes Absolute: 0.4 10*3/uL (ref 0.1–1.0)
Monocytes Relative: 4 % (ref 3–12)
Neutro Abs: 6.1 10*3/uL (ref 1.7–7.7)
Neutrophils Relative %: 68 % (ref 43–77)
Platelets: 313 10*3/uL (ref 150–400)
RBC: 4.29 MIL/uL (ref 3.87–5.11)
RDW: 17.5 % — ABNORMAL HIGH (ref 11.5–15.5)
WBC: 9 10*3/uL (ref 4.0–10.5)

## 2011-11-06 LAB — HEMOGLOBIN A1C
Hgb A1c MFr Bld: 4.6 % (ref <5.7)
Mean Plasma Glucose: 85 mg/dL (ref <117)

## 2011-11-06 LAB — TSH: TSH: 1.309 u[IU]/mL (ref 0.350–4.500)

## 2011-11-06 NOTE — Progress Notes (Signed)
Patient ID: Krystal Barber, female   DOB: Jul 27, 1966, 46 y.o.   MRN: [id number] Pt called stating pharmacy never got rx for klonopin 0.5 mg. rx called in to [facility] on [address] per request.

## 2011-11-20 ENCOUNTER — Telehealth: Payer: Self-pay | Admitting: *Deleted

## 2011-11-20 ENCOUNTER — Encounter: Payer: Self-pay | Admitting: *Deleted

## 2011-11-20 DIAGNOSIS — R058 Other specified cough: Secondary | ICD-10-CM

## 2011-11-20 DIAGNOSIS — R05 Cough: Secondary | ICD-10-CM

## 2011-11-20 NOTE — Telephone Encounter (Signed)
Patient wanted to let you know Dr. Dub Mikes does not accept new patients and wants to know if you know of another "[facility]" psych that accepts new patients.  They have NP there that could see her but she said she has a lot of issues and would rather have an MD. Please advise.  Patient is aware you are out of office until Monday and not expecting an answer until then.

## 2011-11-20 NOTE — Progress Notes (Signed)
Spoke with Tammy with Health Dept.  She said patient said she needed to be seen for TB.  Informed Tammy that patient had chest xray and was neg and would be seeing a pulmonologist next month for the chronic cough.  Informed will see them first before further testing.

## 2011-11-20 NOTE — Telephone Encounter (Signed)
Pt called stating that she would need referral to see Dr. Delford Field at [facility]. Order placed in computer. Pt informed with this as well.

## 2011-11-20 NOTE — Progress Notes (Signed)
Patient ID: Krystal Barber, female   DOB: 1966/04/03, 46 y.o.   MRN: [id number]

## 2011-11-22 NOTE — Telephone Encounter (Signed)
Amy, Please see if Dr. Jamas Lav or Dr. Lucrezia Starch or Dr. Emerson Monte is accepting new patients. Thanks

## 2011-11-25 NOTE — Telephone Encounter (Signed)
Gave patient information.  She will call for [facility].

## 2011-11-26 ENCOUNTER — Ambulatory Visit
Admission: RE | Admit: 2011-11-26 | Discharge: 2011-11-26 | Disposition: A | Discharge status: Home / Self Care | Payer: BC Managed Care – PPO | Source: Ambulatory Visit | Attending: Gynecology | Admitting: Gynecology

## 2011-11-26 DIAGNOSIS — Z1231 Encounter for screening mammogram for malignant neoplasm of breast: Secondary | ICD-10-CM

## 2011-12-18 ENCOUNTER — Encounter: Payer: Self-pay | Admitting: Critical Care Medicine

## 2011-12-18 ENCOUNTER — Ambulatory Visit (INDEPENDENT_AMBULATORY_CARE_PROVIDER_SITE_OTHER): Payer: BC Managed Care – PPO | Admitting: Critical Care Medicine

## 2011-12-18 VITALS — BP 139/80 | HR 84 | Temp 98.1°F | Ht 64.0 in | Wt 224.6 lb

## 2011-12-18 DIAGNOSIS — R059 Cough, unspecified: Secondary | ICD-10-CM

## 2011-12-18 DIAGNOSIS — R05 Cough: Secondary | ICD-10-CM

## 2011-12-18 DIAGNOSIS — K648 Other hemorrhoids: Secondary | ICD-10-CM

## 2011-12-18 DIAGNOSIS — G2581 Restless legs syndrome: Secondary | ICD-10-CM | POA: Insufficient documentation

## 2011-12-18 MED ORDER — OMEPRAZOLE 20 MG PO CPDR: 20.0000 mg | DELAYED_RELEASE_CAPSULE | Freq: Every day | ORAL | Status: DC

## 2011-12-18 MED ORDER — HYDROCOD POLST-CPM POLST ER 10-8 MG PO CP12: 1.0000 | ORAL_CAPSULE | Freq: Two times a day (BID) | ORAL | Status: DC | PRN

## 2011-12-18 MED ORDER — BENZONATATE 100 MG PO CAPS: ORAL_CAPSULE | ORAL | Status: DC

## 2011-12-18 NOTE — Patient Instructions (Signed)
Start Cyclic cough protocol with tussicaps/tessalon Start omeprazole daily Follow reflux diet Return 6 weeks

## 2011-12-18 NOTE — Assessment & Plan Note (Signed)
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

## 2011-12-18 NOTE — Progress Notes (Signed)
Subjective:    Patient ID: Krystal Barber, female    DOB: Nov 07, 1965, 46 y.o.   MRN: [id number]  HPI Comments: Chronic cough 1.5 yrs assoc hoarseness  Cough This is a chronic problem. The current episode started more than 1 year ago. The problem has been unchanged. The problem occurs every few hours. The cough is productive of sputum. Associated symptoms include heartburn. Pertinent negatives include no chest pain, chills, ear congestion, ear pain, fever, headaches, hemoptysis, myalgias, nasal congestion, postnasal drip, rash, rhinorrhea, sore throat, shortness of breath or wheezing. The symptoms are aggravated by cold air, fumes and lying down. She has tried a beta-agonist inhaler and oral steroids for the symptoms. The treatment provided moderate relief. Her past medical history is significant for bronchitis. There is no history of asthma, bronchiectasis, COPD, emphysema, environmental allergies or pneumonia. gets bronchitis twice yearly and uses proair and helps   Past Medical History  Diagnosis Date  . 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 Gestational diabetes      Family History  Problem Relation Age of Onset  . Diabetes Mother      History   Social History  . Marital Status: Divorced    Spouse Name: N/A    Number of Children: 3  . Years of Education: N/A   Occupational History  . Unemployed    Social History Main Topics  . Smoking status: Never Smoker   . Smokeless tobacco: Never Used  . Alcohol Use: No  . Drug Use: No  . Sexually Active: Yes    Birth Control/ Protection: Surgical     TUBAL LIGATION   Other Topics Concern  . Not on file   Social History Narrative  . No narrative on file     Allergies  Allergen Reactions  . Acetaminophen     SEIZURE LIKE SX'S     No outpatient prescriptions prior to visit.       Review of Systems  Constitutional: Negative for fever, chills and unexpected weight change.  HENT: Positive for  trouble swallowing. Negative for ear pain, nosebleeds, congestion, sore throat, rhinorrhea, sneezing, dental problem, voice change, postnasal drip and sinus pressure.        Hoarse Sore throat  Eyes: Negative for visual disturbance.  Respiratory: Positive for cough. Negative for hemoptysis, choking, shortness of breath and wheezing.   Cardiovascular: Negative for chest pain and leg swelling.  Gastrointestinal: Positive for heartburn. Negative for vomiting, abdominal pain and diarrhea.  Genitourinary: Negative for difficulty urinating.  Musculoskeletal: Positive for arthralgias. Negative for myalgias.  Skin: Negative for rash.  Neurological: Negative for tremors, syncope and headaches.  Hematological: Negative for environmental allergies. Does not bruise/bleed easily.  Psychiatric/Behavioral: The patient is nervous/anxious.        Objective:   Physical Exam  Filed Vitals:   12/18/11 0935  BP: 139/80  Pulse: 84  Temp: 98.1 F (36.7 C)  TempSrc: Oral  Height: 5\' 4"  (1.626 m)  Weight: 224 lb 9.6 oz (101.878 kg)  SpO2: 99%    Gen: Pleasant, well-nourished, in no distress,  normal affect  ENT: No lesions,  mouth clear,  oropharynx clear, no postnasal drip  Neck: No JVD, no TMG, no carotid bruits  Lungs: No use of accessory muscles, no dullness to percussion, moderate pseudo-wheeze  Cardiovascular: RRR, heart sounds normal, no murmur or gallops, no peripheral edema  Abdomen: soft and NT, no HSM,  BS normal  Musculoskeletal: No deformities, no  cyanosis or clubbing  Neuro: alert, non focal  Skin: Warm, no lesions or rashes   CXR : no active disease Spiro: normal       Assessment & Plan:   Cough 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 cetirizine (ZYRTEC) 10 MG tablet Take 10 mg by mouth daily.      . clonazePAM (KLONOPIN) 0.5 MG tablet 1 tablet daily      . DISCONTD: famotidine (PEPCID) 20 MG tablet Take 20 mg by mouth daily as needed.      . benzonatate (TESSALON) 100 MG capsule Use per cough protocol 1-2 4 times daily  90 capsule  4  . Hydrocod Polst-Chlorphen Polst (TUSSICAPS) 10-8 MG CP12 Take 1 capsule by mouth 2 (two) times daily as needed.  20 each  0  . omeprazole (PRILOSEC) 20 MG capsule Take 1 capsule (20 mg total) by mouth daily.  30 capsule  4

## 2011-12-19 ENCOUNTER — Emergency Department (HOSPITAL_COMMUNITY)
Admission: EM | Admit: 2011-12-19 | Discharge: 2011-12-19 | Disposition: A | Discharge status: Home / Self Care | Payer: BC Managed Care – PPO | Attending: Emergency Medicine | Admitting: Emergency Medicine

## 2011-12-19 ENCOUNTER — Emergency Department (HOSPITAL_COMMUNITY): Payer: BC Managed Care – PPO

## 2011-12-19 ENCOUNTER — Encounter (HOSPITAL_COMMUNITY): Payer: Self-pay | Admitting: Emergency Medicine

## 2011-12-19 DIAGNOSIS — R5381 Other malaise: Secondary | ICD-10-CM | POA: Insufficient documentation

## 2011-12-19 DIAGNOSIS — T7840XA Allergy, unspecified, initial encounter: Secondary | ICD-10-CM

## 2011-12-19 DIAGNOSIS — R6883 Chills (without fever): Secondary | ICD-10-CM | POA: Insufficient documentation

## 2011-12-19 DIAGNOSIS — R109 Unspecified abdominal pain: Secondary | ICD-10-CM | POA: Insufficient documentation

## 2011-12-19 DIAGNOSIS — R509 Fever, unspecified: Secondary | ICD-10-CM | POA: Insufficient documentation

## 2011-12-19 DIAGNOSIS — Z79899 Other long term (current) drug therapy: Secondary | ICD-10-CM | POA: Insufficient documentation

## 2011-12-19 DIAGNOSIS — T483X5A Adverse effect of antitussives, initial encounter: Secondary | ICD-10-CM | POA: Insufficient documentation

## 2011-12-19 DIAGNOSIS — K219 Gastro-esophageal reflux disease without esophagitis: Secondary | ICD-10-CM | POA: Insufficient documentation

## 2011-12-19 DIAGNOSIS — R059 Cough, unspecified: Secondary | ICD-10-CM | POA: Insufficient documentation

## 2011-12-19 DIAGNOSIS — IMO0001 Reserved for inherently not codable concepts without codable children: Secondary | ICD-10-CM | POA: Insufficient documentation

## 2011-12-19 DIAGNOSIS — R0602 Shortness of breath: Secondary | ICD-10-CM | POA: Insufficient documentation

## 2011-12-19 DIAGNOSIS — R0789 Other chest pain: Secondary | ICD-10-CM | POA: Insufficient documentation

## 2011-12-19 DIAGNOSIS — R000 Tachycardia, unspecified: Secondary | ICD-10-CM | POA: Insufficient documentation

## 2011-12-19 DIAGNOSIS — R05 Cough: Secondary | ICD-10-CM

## 2011-12-19 DIAGNOSIS — R110 Nausea: Secondary | ICD-10-CM | POA: Insufficient documentation

## 2011-12-19 MED ORDER — PREDNISONE 10 MG PO TABS: 20.0000 mg | ORAL_TABLET | Freq: Every day | ORAL | Status: DC

## 2011-12-19 MED ORDER — HYDROCODONE-HOMATROPINE 5-1.5 MG PO TABS: 1.0000 | ORAL_TABLET | Freq: Every day | ORAL | Status: DC

## 2011-12-19 MED ORDER — METHYLPREDNISOLONE SODIUM SUCC 125 MG IJ SOLR
125.0000 mg | Freq: Once | INTRAMUSCULAR | Status: AC
  Administered 2011-12-19: 125 mg via INTRAVENOUS
  Filled 2011-12-19: qty 2

## 2011-12-19 MED ORDER — FAMOTIDINE IN NACL 20-0.9 MG/50ML-% IV SOLN
20.0000 mg | Freq: Once | INTRAVENOUS | Status: AC
  Administered 2011-12-19: 20 mg via INTRAVENOUS
  Filled 2011-12-19: qty 50

## 2011-12-19 MED ORDER — ALBUTEROL SULFATE (5 MG/ML) 0.5% IN NEBU
2.5000 mg | INHALATION_SOLUTION | Freq: Once | RESPIRATORY_TRACT | Status: AC
  Administered 2011-12-19: 2.5 mg via RESPIRATORY_TRACT
  Filled 2011-12-19: qty 0.5

## 2011-12-19 MED ORDER — IBUPROFEN 800 MG PO TABS
800.0000 mg | ORAL_TABLET | Freq: Once | ORAL | Status: AC
  Administered 2011-12-19: 800 mg via ORAL
  Filled 2011-12-19: qty 1

## 2011-12-19 NOTE — Discharge Instructions (Signed)
Taking the [employer] I have given you a different medication that does not contain Tylenol nor benzoate.  Please take this as directed, please call Dr. Birder Robson on Monday morning and followup with him

## 2011-12-19 NOTE — ED Provider Notes (Signed)
History     CSN: [id number]  Arrival date & time 12/19/11  0157   First MD Initiated Contact with Patient 12/19/11 0444      Chief Complaint  Patient presents with  . Fever  . Cough  . Generalized Body Aches    (Consider location/radiation/quality/duration/timing/severity/associated sxs/prior treatment) HPI Comments: Patient states she was seen yesterday by Dr. Delford Field for chronic cough.  She was prescribed Jerilynn Som, which she took 2 of her instructions.  Shortly thereafter, she developed allergic type reaction, shortness of breath, chest tightness, tachycardia, nausea, abdominal cramping.  She's had this reaction before to Tylenol  The history is provided by the patient and the spouse.    Past Medical History  Diagnosis Date  . 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 Gestational diabetes     Past Surgical History  Procedure Date  . Tubal ligation 2004  . Cholecystectomy 11/1994  . Pelvic laparoscopy 2000    RIGHT OVARIAN CYSTECTOMY  . Gynecologic cryosurgery 1992    Family History  Problem Relation Age of Onset  . Diabetes Mother     History  Substance Use Topics  . Smoking status: Never Smoker   . Smokeless tobacco: Never Used  . Alcohol Use: No    OB History    Grav Para Term Preterm Abortions TAB SAB Ect Mult Living   6 4 4  2  2   4       Review of Systems  Constitutional: Positive for chills. Negative for fever.  Respiratory: Positive for cough and shortness of breath. Negative for wheezing.   Gastrointestinal: Positive for nausea and abdominal pain. Negative for vomiting.  Neurological: Positive for weakness. Negative for dizziness.    Allergies  Acetaminophen  Home Medications   Current Outpatient Rx  Name Route Sig Dispense Refill  . CETIRIZINE HCL 10 MG PO TABS Oral Take 10 mg by mouth daily.    Marland Kitchen CLONAZEPAM 0.5 MG PO TABS Oral Take 0.5 mg by mouth 2 (two) times daily as needed. Anxiety    . OMEPRAZOLE 20 MG PO  CPDR Oral Take 1 capsule (20 mg total) by mouth daily. 30 capsule 4  . HYDROCODONE-HOMATROPINE 5-1.5 MG PO TABS Oral Take 1 tablet by mouth 6 (six) times daily. 56 each 0  . PREDNISONE 10 MG PO TABS Oral Take 2 tablets (20 mg total) by mouth daily. 15 tablet 0    BP 107/70  Pulse 125  Temp(Src) 101.9 F (38.8 C) (Oral)  Resp 18  SpO2 100%  LMP 12/19/2011  Physical Exam  Constitutional: She appears well-developed and well-nourished.  HENT:  Head: Normocephalic.  Eyes: Pupils are equal, round, and reactive to light.  Cardiovascular: Tachycardia present.   Pulmonary/Chest: Effort normal and breath sounds normal. No respiratory distress. She has no wheezes. She exhibits no tenderness.  Abdominal: Soft. Bowel sounds are normal. She exhibits no distension. There is no tenderness.  Musculoskeletal: Normal range of motion.  Neurological: She is alert.  Skin: Skin is warm and dry. No rash noted.    ED Course  Procedures (including critical care time)  Labs Reviewed - No data to display Dg Chest 2 View  12/19/2011  *RADIOLOGY REPORT*  Clinical Data: Fever, productive cough, shortness of breath, occasional mid chest pain, left upper quadrant pain, allergic reaction to medication  CHEST - 2 VIEW  Comparison: 11/05/2011  Findings: Upper-normal size of cardiac silhouette. Mediastinal contours and pulmonary vascularity normal. Minimal chronic  peribronchial thickening. Lungs otherwise clear. No pleural effusion or pneumothorax. Bones unremarkable.  IMPRESSION: Minimal bronchitic changes without acute infiltrate.  Original Report Authenticated By: Lollie Marrow, M.D.     1. GERD   2. Cough   3. Allergic reaction caused by a drug       MDM  This is most likely an allergic reaction to [facility], which is the only new medication she is taking After IV Solu-Medrol, Pepcid, and albuterol treatment.  Patient is feeling better.  She is now having the chest tightness, able to move more air  at this time.  She is no longer tachycardic.  No longer having abdominal pain       Arman Filter, NP 12/19/11 0554  Arman Filter, NP 12/19/11 [phone]

## 2011-12-19 NOTE — ED Provider Notes (Signed)
Medical screening examination/treatment/procedure(s) were performed by non-physician practitioner and as supervising physician I was immediately available for consultation/collaboration.   Ehsan Corvin L Rodrecus Belsky, MD 12/19/11 0749 

## 2011-12-19 NOTE — ED Notes (Signed)
Per EMS , pt.  Is from home withy complaint of generalized body aches, with cough and fever . Denies of chest pain / SOB.  Reports of feeling nauseous.

## 2011-12-21 ENCOUNTER — Ambulatory Visit (INDEPENDENT_AMBULATORY_CARE_PROVIDER_SITE_OTHER): Payer: BC Managed Care – PPO | Admitting: Critical Care Medicine

## 2011-12-21 ENCOUNTER — Encounter: Payer: Self-pay | Admitting: Critical Care Medicine

## 2011-12-21 ENCOUNTER — Telehealth: Payer: Self-pay | Admitting: *Deleted

## 2011-12-21 VITALS — BP 124/72 | HR 108 | Temp 98.6°F | Ht 64.0 in | Wt 220.6 lb

## 2011-12-21 DIAGNOSIS — R05 Cough: Secondary | ICD-10-CM

## 2011-12-21 DIAGNOSIS — R059 Cough, unspecified: Secondary | ICD-10-CM

## 2011-12-21 MED ORDER — DM-GUAIFENESIN ER 30-600 MG PO TB12: ORAL_TABLET | ORAL | Status: DC

## 2011-12-21 MED ORDER — BECLOMETHASONE DIPROPIONATE 80 MCG/ACT NA AERS: 2.0000 | INHALATION_SPRAY | Freq: Every day | NASAL | Status: DC

## 2011-12-21 MED ORDER — AZITHROMYCIN 250 MG PO TABS: 250.0000 mg | ORAL_TABLET | Freq: Every day | ORAL | Status: AC

## 2011-12-21 MED ORDER — PREDNISONE 10 MG PO TABS: ORAL_TABLET | ORAL | Status: DC

## 2011-12-21 NOTE — Progress Notes (Signed)
Subjective:    Patient ID: Krystal Barber, female    DOB: [birth year], 46 y.o.   MRN: [id number]  HPI 12/21/2011  Pt seen 12/18/11 Rx tussicaps Kimberlee Nearing.  Felt to have rxn to tperles and went to ED over weekend Started with tessalon perles.  When this happened, shook bad and sweating.  Similar rxn. Pt did not fill any meds from ED. Pt had fever. BP low.  Cough was dry, then now gets green mucus out .  Coughs up hard green mucus. IF gets Zpak and this helps. Past Medical History  Diagnosis Date  . 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 Gestational diabetes      Family History  Problem Relation Age of Onset  . Diabetes Mother      History   Social History  . Marital Status: Divorced    Spouse Name: N/A    Number of Children: 3  . Years of Education: N/A   Occupational History  . Unemployed    Social History Main Topics  . Smoking status: Never Smoker   . Smokeless tobacco: Never Used  . Alcohol Use: No  . Drug Use: No  . Sexually Active: Yes    Birth Control/ Protection: Surgical     TUBAL LIGATION   Other Topics Concern  . Not on file   Social History Narrative  . No narrative on file     Allergies  Allergen Reactions  . Acetaminophen     SEIZURE LIKE SX'S  . Benzonatate (Tessalon Perles)     Chills, shaking, SOB, chest tightness     Outpatient Prescriptions Prior to Visit  Medication Sig Dispense Refill  . clonazePAM (KLONOPIN) 0.5 MG tablet Take 0.5 mg by mouth 2 (two) times daily as needed. Anxiety      . omeprazole (PRILOSEC) 20 MG capsule Take 1 capsule (20 mg total) by mouth daily.  30 capsule  4  . cetirizine (ZYRTEC) 10 MG tablet Take 10 mg by mouth daily.      . Hydrocodone-Homatropine 5-1.5 MG TABS Take 1 tablet by mouth 6 (six) times daily.  56 each  0  . predniSONE (DELTASONE) 10 MG tablet Take 2 tablets (20 mg total) by mouth daily.  15 tablet  0       Review of Systems  Constitutional: Negative for unexpected  weight change.  HENT: Positive for trouble swallowing. Negative for nosebleeds, congestion, sneezing, dental problem, voice change and sinus pressure.        Hoarse Sore throat  Eyes: Negative for visual disturbance.  Respiratory: Negative for choking.   Cardiovascular: Negative for leg swelling.  Gastrointestinal: Negative for vomiting, abdominal pain and diarrhea.  Genitourinary: Negative for difficulty urinating.  Musculoskeletal: Positive for arthralgias.  Neurological: Negative for tremors and syncope.  Hematological: Does not bruise/bleed easily.  Psychiatric/Behavioral: The patient is nervous/anxious.        Objective:   Physical Exam   Filed Vitals:   12/21/11 1532  BP: 124/72  Pulse: 108  Temp: 98.6 F (37 C)  TempSrc: Oral  Height: 5\' 4"  (1.626 m)  Weight: 220 lb 9.6 oz (100.064 kg)  SpO2: 95%    Gen: well-nourished, in no distress,  normal affect  ENT: No lesions,  mouth clear,  oropharynx clear,  +++postnasal drip, moderate purulence right greater than left nares  Neck: No JVD, no TMG, no carotid bruits  Lungs: No use of accessory muscles, no dullness to percussion, moderate  pseudo-wheeze  Cardiovascular: RRR, heart sounds normal, no murmur or gallops, no peripheral edema  Abdomen: soft and NT, no HSM,  BS normal  Musculoskeletal: No deformities, no cyanosis or clubbing  Neuro: alert, non focal  Skin: Warm, no lesions or rashes   CXR : no active disease Spiro: normal       Assessment & Plan:   Cough Cyclical cough on the basis of upper airway instability, reflux disease and now acute on chronic sinusitis No evidence of primary pulmonary process Adverse reaction to benzonatate  Plan Resume cyclic cough protocol with Tussi caps and Mucinex DM Discontinue all further benzonatate Treat sinusitis with a course of azithromycin and prednisone      Updated Medication List Outpatient Encounter Prescriptions as of 12/21/2011  Medication Sig  Dispense Refill  . clonazePAM (KLONOPIN) 0.5 MG tablet Take 0.5 mg by mouth 2 (two) times daily as needed. Anxiety      . Hydrocod Polst-Chlorphen Polst (TUSSICAPS) 10-8 MG CP12 Take 1 capsule by mouth 2 (two) times daily.      Marland Kitchen omeprazole (PRILOSEC) 20 MG capsule Take 1 capsule (20 mg total) by mouth daily.  30 capsule  4  . DISCONTD: guaiFENesin (MUCINEX) 600 MG 12 hr tablet Take 600 mg by mouth every 4 (four) hours.      Marland Kitchen azithromycin (ZITHROMAX) 250 MG tablet Take 1 tablet (250 mg total) by mouth daily. Take two once then one daily until gone  6 each  0  . Beclomethasone Dipropionate (QNASL) 80 MCG/ACT AERS Place 2 puffs into the nose daily.  1 Inhaler  6  . cetirizine (ZYRTEC) 10 MG tablet Take 10 mg by mouth daily.      Marland Kitchen dextromethorphan-guaiFENesin (MUCINEX DM) 30-600 MG per 12 hr tablet 1-2 twice daily per cough protocol      . predniSONE (DELTASONE) 10 MG tablet Take 4 for two days three for two days two for two days one for two days  20 tablet  0  . DISCONTD: Hydrocodone-Homatropine 5-1.5 MG TABS Take 1 tablet by mouth 6 (six) times daily.  56 each  0  . DISCONTD: predniSONE (DELTASONE) 10 MG tablet Take 2 tablets (20 mg total) by mouth daily.  15 tablet  0

## 2011-12-21 NOTE — Patient Instructions (Signed)
Finish cough protocol with mucinex DM Start azithromycin 250mg  Take two once then one daily until gone Prednisone 10mg  Take 4 for two days three for two days two for two days one for two days Qnasl two puff each nostril daily Return 3 weeks

## 2011-12-21 NOTE — Assessment & Plan Note (Signed)
Cyclical cough on the basis of upper airway instability, reflux disease and now acute on chronic sinusitis No evidence of primary pulmonary process Adverse reaction to benzonatate  Plan Resume cyclic cough protocol with Tussi caps and Mucinex DM Discontinue all further benzonatate Treat sinusitis with a course of azithromycin and prednisone

## 2011-12-21 NOTE — Telephone Encounter (Signed)
Ok, and thanks

## 2011-12-21 NOTE — Telephone Encounter (Signed)
Called, spoke with pt.  Offered OV today at 3:15 with PW but she is unable to come in.  PW does not have any openings at [facility] anytime soon, pt does not want to come to [facility], and is ok with seeing TP.  OV scheduled with TP on March 28 at 11:30 am -- pt aware and voiced no further questions at this time.

## 2011-12-23 ENCOUNTER — Encounter: Payer: Self-pay | Admitting: *Deleted

## 2011-12-24 ENCOUNTER — Inpatient Hospital Stay: Payer: BC Managed Care – PPO | Admitting: Adult Health

## 2011-12-29 ENCOUNTER — Telehealth: Payer: Self-pay | Admitting: Critical Care Medicine

## 2011-12-29 NOTE — Telephone Encounter (Signed)
Form received and placed in Dr. Lynelle Doctor look at for signature. Will forward msg to Crystal so she may follow-up on this.

## 2011-12-29 NOTE — Telephone Encounter (Signed)
PA completed and signed by PW.  Form faxed back to [facility] at [phone].  Form placed in triage with the awaiting approval/denial forms.  Will hold in my box.

## 2011-12-29 NOTE — Telephone Encounter (Signed)
Qnasl requires PA. Called [employer] at [phone]. Member # [id number]. Per [employer], there is a PA form for Qnasl and this will need to be completed by provider and faxed back to them. Will await form.

## 2011-12-30 NOTE — Telephone Encounter (Signed)
I spoke with pt and she is aware. Nothing further was needed 

## 2011-12-30 NOTE — Telephone Encounter (Signed)
Received approval letter for qnasal.  APPROVED 12/30/2011 - 09/24/2014.  Angelique Blonder with [facility] [employer] aware.  LMOMTCB to inform pt.   Approval letter placed in PW's scan folder.

## 2012-01-05 ENCOUNTER — Ambulatory Visit (INDEPENDENT_AMBULATORY_CARE_PROVIDER_SITE_OTHER): Payer: BC Managed Care – PPO | Admitting: Internal Medicine

## 2012-01-05 ENCOUNTER — Encounter: Payer: Self-pay | Admitting: Internal Medicine

## 2012-01-05 VITALS — BP 100/80 | HR 82 | Ht 64.0 in | Wt 220.6 lb

## 2012-01-05 DIAGNOSIS — R1013 Epigastric pain: Secondary | ICD-10-CM

## 2012-01-05 DIAGNOSIS — K219 Gastro-esophageal reflux disease without esophagitis: Secondary | ICD-10-CM

## 2012-01-05 DIAGNOSIS — K3189 Other diseases of stomach and duodenum: Secondary | ICD-10-CM

## 2012-01-05 MED ORDER — SUCRALFATE 1 G PO TABS: 1.0000 g | ORAL_TABLET | Freq: Two times a day (BID) | ORAL | Status: DC

## 2012-01-05 MED ORDER — ESOMEPRAZOLE MAGNESIUM 40 MG PO CPDR: 40.0000 mg | DELAYED_RELEASE_CAPSULE | Freq: Two times a day (BID) | ORAL | Status: DC

## 2012-01-05 NOTE — Progress Notes (Signed)
Krystal Barber [birth year] MRN [id number]  History of Present Illness:  This is a 46 year old white female with a chronic cough evaluated by Dr. Delford Field. She also has gastroesophageal reflux and occasional dysphagia. An upper endoscopy in July 2010 showed a 2 cm hiatal hernia and reflux esophagitis with erosions. She denies taking ibuprofen or any anti-inflammatory medication. She denies smoking or drinking excessive alcohol. She has gained 11 pounds since her last appointment in 2010. She complains of a burning sensation in the epigastrium, bloating and nausea postprandially. She also has reflux of the food at night. There is a history of a remote cholecystectomy in 1995. She has been on Klonopin twice a day.   Past Medical History  Diagnosis Date  . 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 Gestational diabetes   . GERD (gastroesophageal reflux disease)   . Internal hemorrhoids   . Anemia    Past Surgical History  Procedure Date  . Tubal ligation 2004  . Cholecystectomy 11/1994  . Pelvic laparoscopy 2000    RIGHT OVARIAN CYSTECTOMY  . Gynecologic cryosurgery 1992  . Hand surgery     left    reports that she has quit smoking. She has never used smokeless tobacco. She reports that she does not drink alcohol or use illicit drugs. family history includes Diabetes in her mother.  There is no history of Colon cancer. Allergies  Allergen Reactions  . Acetaminophen     SEIZURE LIKE SX'S  . Benzonatate (Tessalon Perles)     Chills, shaking, SOB, chest tightness        Review of Systems: Occasional dysphagia. Denies shortness of breath. Positive chest pain  The remainder of the 10 point ROS is negative except as outlined in H&P   Physical Exam: General appearance  Well developed, in no distress. Eyes- non icteric. HEENT nontraumatic, normocephalic. Her voice is hoarse Mouth no lesions, tongue papillated, no cheilosis. Neck supple without adenopathy, thyroid  not enlarged, no carotid bruits, no JVD. Lungs Clear to auscultation bilaterally. Cor normal S1, normal S2, regular rhythm, no murmur,  quiet precordium. Abdomen: Massively obese, soft with tenderness in epigastrium. No pulsations are palpable mass. No ascites. Post laparoscopic cholecystectomy scar. Lower abdomen unremarkable. Rectal: Deferred. Extremities no pedal edema. Skin no lesions. Neurological alert and oriented x 3. Psychological normal mood and affect.  Assessment and Plan:  Problem #1 Bloating, dyspepsia and symptoms of gastroesophageal reflux not controlled on Prilosec 20 mg daily. Her excessive weight as well as possible delayed gastric emptying due to Klonopin may be responsible for her symptoms. We will start her on Nexium 40 mg twice a day and Carafate 1 g twice a day. She will be scheduled for an upper endoscopy and possible dilation with biopsies to rule out Barrett's esophagus. We have discussed a low-fat diet and strict antireflux measures including weight loss.     01/05/2012 Krystal Barber

## 2012-01-05 NOTE — Patient Instructions (Addendum)
You have been scheduled for an endoscopy with propofol. Please follow written instructions given to you at your visit today. We have sent the following medications to your pharmacy for you to pick up at your convenience: Nexium twice daily (in place of omeprazole) Carafate CC: Dr J.Fernandez

## 2012-01-11 ENCOUNTER — Ambulatory Visit: Payer: BC Managed Care – PPO | Admitting: Critical Care Medicine

## 2012-01-27 ENCOUNTER — Encounter: Payer: Self-pay | Admitting: Gynecology

## 2012-01-27 ENCOUNTER — Ambulatory Visit (INDEPENDENT_AMBULATORY_CARE_PROVIDER_SITE_OTHER): Payer: BC Managed Care – PPO | Admitting: Gynecology

## 2012-01-27 VITALS — BP 124/80

## 2012-01-27 DIAGNOSIS — N951 Menopausal and female climacteric states: Secondary | ICD-10-CM

## 2012-01-27 LAB — FOLLICLE STIMULATING HORMONE: FSH: 5.2 m[IU]/mL

## 2012-01-27 NOTE — Patient Instructions (Addendum)
Nonhormonal treatments for menopausal symptoms  All topics are updated as new evidence becomes available and our peer review process is complete.  Literature review current through: Sep 2012.  This topic last updated: Sep 12, 2010.  INTRODUCTION -- During a woman's reproductive years, the ovaries produce estrogen and progesterone. Estrogen is important for normal menstrual periods and fertility, and it promotes bone strength. Estrogen and progesterone levels fall at the time of menopause, causing well-known symptoms such as hot flashes. Postmenopausal hormone therapy is the term used to describe the two hormones, estrogen and progestin, that are the most effective treatments available to relieve bothersome symptoms of menopause. However, some women cannot take estrogen, for example, women with breast cancer. Other women choose not to take hormone therapy. Fortunately, there are some alternatives to hormone therapy to treat menopausal symptoms. Although they may not be as effective as estrogen, they do provide some relief.  This article discusses alternatives to postmenopausal hormone therapy. A separate article discusses the risks, benefits, and options for hormone therapy. (See "Patient information: Postmenopausal hormone therapy (Beyond the Basics)".) CONTROLLING HOT FLASHES -- Non-estrogen treatments for hot flashes are effective in many women. None work as well as estrogen, but they are better than placebo (sugar pills). Not all women need treatment for hot flashes since they are mild in some women. Options include: Gabapentin -- Gabapentin (Neurontin) is a drug that is primarily used to treat seizures. It also relieves hot flashes in some women, when given as a single bedtime dose. Antidepressants -- Antidepressant medications are recommended as a first line treatment for hot flashes in women who cannot take estrogen. Venlafaxine (brand name Effexor), citalopram  (brand name Celexa), and escitalopram (brand name Lexapro) were developed to treat depression, but studies show that they are an effective treatment for hot flashes. Paroxetine (brand name Paxil) is also effective for hot flashes, but you should not take paroxetine if you have breast cancer and are taking tamoxifen. The concern is that paroxetine can interfere with tamoxifen and make it less effective.  Fluoxetine (brand name Prozac) is also effective, but might not work as well as venlafaxine, citalopram, escitalopram, and paroxetine. Sertraline (Zoloft) is not helpful for treating hot flashes. Other antidepressant side effects and interactions are discussed in detail in a separate article. (See "Patient information: Depression treatment options for adults (Beyond the Basics)".) Progesterone -- The injectable progestin birth control hormone, medroxyprogesterone acetate (Depo-Provera) helps to reduce hot flashes. This option is not used as often as gabapentin or antidepressants. Plant-derived estrogens (phytoestrogens) -- Plant-derived estrogens have been marketed as a "natural" or "safer" alternative to hormones for women with menopausal symptoms. Phytoestrogens are found in many foods, including soybeans, chickpeas, lentils, flaxseed, lentils, grains, fruits, vegetables, and red clover. Isoflavone supplements, a type of phytoestrogen, can be purchased in health food stores.  However, there is no convincing evidence that phytoestrogens help to reduce hot flashes or night sweats. In addition, some phytoestrogens might act like estrogen in some tissues of the body. Many experts suggest that women who have a history of breast cancer should avoid phytoestrogens. Herbal treatments -- A number of herbal treatments have been promoted as a "natural" remedy for hot flashes. In fact, many postmenopausal women use black cohosh for hot flashes, but clinical trials have shown that it is not more effective than  placebo. In addition, there are safety concerns about some herbs, including black cohosh, which might stimulate breast tissue (similar to estrogen). Herbal treatments are not recommended for hot  flashes or other menopausal symptoms. TREATING VAGINAL DRYNESS -- Vaginal estrogen is a very effective treatment for postmenopausal women with vaginal dryness or pain with intercourse. This is a treatment that women can continue for many years after menopause because it does not get into the bloodstream and increase the risk of breast cancer, heart attack, or stroke. This is discussed in more detail in a separate article.

## 2012-01-27 NOTE — Progress Notes (Signed)
46 y.o. seen in the office on 11/05/2011 for her overdue annual gynecological exam. Patient's had multitude of complaints. 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 Patient overweight although was weighing 219 pounds now weighing 213. She's had a previous tubal sterilization procedure her cycles are reported to be regular. One of her main complaints is she has much anxiety and wakes up during the night with sexual fantasies that sometimes she even acts out. She was referred to the psychiatrist and is currently under treatment for depression and also her primary has begun treating her for restless leg syndrome. She came to the office today with concerns of of hot flashes irritability and mood swing. In February of this year we had tested her TSH which was normal. She has not skipping her periods. She stated that these symptoms were present before she went on antidepressant medication.  On further questioning she stated that her mother reached her menopause in her mid 65s. We had an extensive discussion today of the perimenopause timing which may explain part of her symptoms and for this reason we will check her [facility] today. New studies released recently in the Journal of the [place] Menopausal Society has recommended 2  antidepressant agents that may alleviate some of these vasomotor symptoms in patients who are not yet menopausal and have depression or those who are in the menopause but estrogens are contraindicated. They are Prestiq and Effexor. Since she was recently put on Zoloft I am going to contact her psychiatrist to see if she can be gradually transitioned to one of these 2 products that will take care of both issues. Will notify her if there is any abnormality in her [facility].  It was recommended that she engage in some form of weightbearing exercises 45 minutes 3 times a week. Literature information up on appropriate diet and exercise  was provided as well. We discussed also  the use of soy products. All these recommendation should help most of her symptoms.

## 2012-02-15 ENCOUNTER — Telehealth: Payer: Self-pay | Admitting: *Deleted

## 2012-02-15 NOTE — Telephone Encounter (Signed)
Pt was seen on 01/27/2012 and was told that she may possibility have her Zoloft switched to different medication(Prestiq and Effexor) due to her having hot flashes irritability and mood swings. Please advise

## 2012-02-17 ENCOUNTER — Telehealth: Payer: Self-pay | Admitting: Gynecology

## 2012-02-17 NOTE — Telephone Encounter (Signed)
Spoke with Dr. Andee Poles (patient psychiatrist) on the following: Patient had been complaining of hot flashes and irritability and mood swing. She stated her mother had reached menopause in her 48s. She had a normal FSH tested in our office on May 1 and was normal (non-menopausal range). New studies released recently in the Journal of the [place] Menopausal Society has recommended 2 antidepressant agents that may alleviate some of these vasomotor symptoms in patients who are not yet menopausal and have depression or those who are in the menopause but estrogens are contraindicated. These medications are either Prestiq or Effexor. Dr. Nolen Mu had recently place her on Zoloft. I spoke with Dr. Nolen Mu today and she will contact the patient in transition her to the Prestiq where she would get the most benefits.

## 2012-02-19 ENCOUNTER — Encounter: Payer: BC Managed Care – PPO | Admitting: Internal Medicine

## 2012-02-23 ENCOUNTER — Ambulatory Visit: Payer: BC Managed Care – PPO | Admitting: Critical Care Medicine

## 2012-11-10 ENCOUNTER — Encounter: Payer: BC Managed Care – PPO | Admitting: Gynecology

## 2013-02-01 ENCOUNTER — Other Ambulatory Visit: Payer: Self-pay

## 2013-02-01 DIAGNOSIS — Z1231 Encounter for screening mammogram for malignant neoplasm of breast: Secondary | ICD-10-CM

## 2013-02-27 ENCOUNTER — Ambulatory Visit
Admission: RE | Admit: 2013-02-27 | Discharge: 2013-02-27 | Disposition: A | Discharge status: Home / Self Care | Payer: Medicaid Other | Source: Ambulatory Visit

## 2013-02-27 DIAGNOSIS — Z1231 Encounter for screening mammogram for malignant neoplasm of breast: Secondary | ICD-10-CM

## 2013-04-11 ENCOUNTER — Ambulatory Visit
Admission: RE | Admit: 2013-04-11 | Discharge: 2013-04-11 | Disposition: A | Discharge status: Home / Self Care | Payer: Medicaid Other | Source: Ambulatory Visit | Attending: Family Medicine | Admitting: Family Medicine

## 2013-04-11 ENCOUNTER — Other Ambulatory Visit: Payer: Self-pay | Admitting: Family Medicine

## 2013-04-11 DIAGNOSIS — R079 Chest pain, unspecified: Secondary | ICD-10-CM

## 2013-06-14 ENCOUNTER — Encounter: Payer: Self-pay | Admitting: Gynecology

## 2013-06-22 ENCOUNTER — Encounter: Payer: Self-pay | Admitting: Gynecology

## 2013-06-22 ENCOUNTER — Ambulatory Visit (INDEPENDENT_AMBULATORY_CARE_PROVIDER_SITE_OTHER): Payer: Medicaid Other | Admitting: Gynecology

## 2013-06-22 VITALS — BP 128/84 | Ht 63.25 in | Wt 219.0 lb

## 2013-06-22 DIAGNOSIS — G4700 Insomnia, unspecified: Secondary | ICD-10-CM | POA: Insufficient documentation

## 2013-06-22 DIAGNOSIS — R232 Flushing: Secondary | ICD-10-CM

## 2013-06-22 DIAGNOSIS — Z01419 Encounter for gynecological examination (general) (routine) without abnormal findings: Secondary | ICD-10-CM

## 2013-06-22 DIAGNOSIS — Z23 Encounter for immunization: Secondary | ICD-10-CM

## 2013-06-22 DIAGNOSIS — R5383 Other fatigue: Secondary | ICD-10-CM | POA: Insufficient documentation

## 2013-06-22 DIAGNOSIS — R5381 Other malaise: Secondary | ICD-10-CM

## 2013-06-22 DIAGNOSIS — N951 Menopausal and female climacteric states: Secondary | ICD-10-CM

## 2013-06-22 NOTE — Progress Notes (Signed)
Krystal Barber Jan 30, 1966 [id number]   History:    47 y.o.  for annual gyn exam with complaints of occasional hot flashes worsening insomnia tiredness and fatigue. 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 She's had a previous tubal sterilization procedure her cycles are reported to be regular. One of her main complaints is she has much anxiety and wakes up during the night with sexual fantasies that sometimes she even acts out. She was referred to the psychiatrist and is currently under treatment for depression and also her primary has begun treating her for restless leg syndrome.the patient stated that her mother recent menopause in her mid 80s. Last year she had a normal FSH. We had discussed 2 antidepressant agents that may alleviate some of these vasomotor symptoms in patients who are not yet menopausal and have depression or those who are in the menopause but estrogens are contraindicated. Patient is currently on Klonopin 0.5 mg which she takes 1 tablet 2 times a day as needed for anxiety and she takes Zoloft 25 mg daily. She takes trazodone 300 mg each bedtime.She denies smoking or drinking excessive alcohol. The patient states she's having normal menstrual cycle.   he patient has had issues with weight gain.  Past medical history,surgical history, family history and social history were all reviewed and documented in the EPIC chart.  Gynecologic History Patient's last menstrual period was 06/09/2013. Contraception: tubal ligation Last Pap: 2013. Results were: normal Last mammogram: 2014. Results were: normal  Obstetric History OB History  Gravida Para Term Preterm AB SAB TAB Ectopic Multiple Living  6 4 4  2 2    4     # Outcome Date GA Lbr Len/2nd Weight Sex Delivery Anes PTL Lv  6 SAB           5 SAB           4 TRM     M SVD  N Y  3 TRM     M SVD  N Y  2 TRM     M SVD  N Y  1 TRM     M SVD  N Y       ROS:  A ROS was performed and pertinent positives and negatives are included in the history.  GENERAL: No fevers or chills. HEENT: No change in vision, no earache, sore throat or sinus congestion. NECK: No pain or stiffness. CARDIOVASCULAR: No chest pain or pressure. No palpitations. PULMONARY: No shortness of breath, cough or wheeze. GASTROINTESTINAL: No abdominal pain, nausea, vomiting or diarrhea, melena or bright red blood per rectum. GENITOURINARY: No urinary frequency, urgency, hesitancy or dysuria. MUSCULOSKELETAL: No joint or muscle pain, no back pain, no recent trauma. DERMATOLOGIC: No rash, no itching, no lesions. ENDOCRINE: No polyuria, polydipsia, no heat or cold intolerance. No recent change in weight. HEMATOLOGICAL: No anemia or easy bruising or bleeding. NEUROLOGIC: No headache, seizures, numbness, tingling or weakness. PSYCHIATRIC: No depression, no loss of interest in normal activity or change in sleep pattern.     Exam: chaperone present  BP 128/84  Ht 5' 3.25" (1.607 m)  Wt 219 lb (99.338 kg)  BMI 38.47 kg/m2  LMP 06/09/2013  Body mass index is 38.47 kg/(m^2).  General appearance : Well developed well nourished female. No acute distress HEENT: Neck supple, trachea midline, no carotid bruits, no thyroidmegaly Lungs: Clear to auscultation, no rhonchi or wheezes, or rib retractions  Heart: Regular  rate and rhythm, no murmurs or gallops Breast:Examined in sitting and supine position were symmetrical in appearance, no palpable masses or tenderness,  no skin retraction, no nipple inversion, no nipple discharge, no skin discoloration, no axillary or supraclavicular lymphadenopathy Abdomen: no palpable masses or tenderness, no rebound or guarding Extremities: no edema or skin discoloration or tenderness  Pelvic:  Bartholin, Urethra, Skene Glands: Within normal limits             Vagina: No gross lesions or discharge  Cervix: No gross lesions or discharge  Uterus  anteverted, normal  size, shape and consistency, non-tender and mobile  Adnexa  Without masses or tenderness  Anus and perineum  normal   Rectovaginal  normal sphincter tone without palpated masses or tenderness             Hemoccult not indicated     Assessment/Plan:  47 y.o. female for annual exam who had been complaining of hot flashes, insomnia as well as tiredness and fatigue. Her voided to check an FSH level today along with TSH, vitamin D, CBC, fasting lipid profile and comprehensive metabolic panel. We'll also run a urinalysis. Pap smear was not done today in accordance to the new guidelines. She did receive the Tdap vaccine today. She was encouraged to do her monthly breast exams. We discussed the importance of calcium and vitamin D and regular exercise for osteoporosis prevention. Patient declined flu vaccine. Patient will be referred to the [facility] neurology department for further assessment of her severe insomnia and erratic behavior such as acting out her fasting and sleep walking. She had been referred to the psychiatrist here recently and she had been placed on antidepressant agent but she states that there has been no improvement in her symptoms    [name] MD, 12:48 PM 06/22/2013

## 2013-06-22 NOTE — Patient Instructions (Addendum)

## 2013-06-23 ENCOUNTER — Other Ambulatory Visit: Payer: Medicaid Other

## 2013-06-23 ENCOUNTER — Telehealth: Payer: Self-pay | Admitting: *Deleted

## 2013-06-23 LAB — URINALYSIS W MICROSCOPIC + REFLEX CULTURE
Casts: NONE SEEN
Crystals: NONE SEEN
Glucose, UA: NEGATIVE mg/dL
Hgb urine dipstick: NEGATIVE
Ketones, ur: NEGATIVE mg/dL
Leukocytes, UA: NEGATIVE
Nitrite: NEGATIVE
Protein, ur: NEGATIVE mg/dL
Specific Gravity, Urine: 1.026 (ref 1.005–1.030)
Urobilinogen, UA: 0.2 mg/dL (ref 0.0–1.0)
pH: 6 (ref 5.0–8.0)

## 2013-06-23 LAB — CBC WITH DIFFERENTIAL/PLATELET
Basophils Absolute: 0 10*3/uL (ref 0.0–0.1)
Basophils Relative: 0 % (ref 0–1)
Eosinophils Absolute: 0.1 10*3/uL (ref 0.0–0.7)
Eosinophils Relative: 1 % (ref 0–5)
HCT: 30.5 % — ABNORMAL LOW (ref 36.0–46.0)
Hemoglobin: 10 g/dL — ABNORMAL LOW (ref 12.0–15.0)
Lymphocytes Relative: 32 % (ref 12–46)
Lymphs Abs: 1.8 10*3/uL (ref 0.7–4.0)
MCH: 26.1 pg (ref 26.0–34.0)
MCHC: 32.8 g/dL (ref 30.0–36.0)
MCV: 79.6 fL (ref 78.0–100.0)
Monocytes Absolute: 0.4 10*3/uL (ref 0.1–1.0)
Monocytes Relative: 7 % (ref 3–12)
Neutro Abs: 3.5 10*3/uL (ref 1.7–7.7)
Neutrophils Relative %: 60 % (ref 43–77)
Platelets: 249 10*3/uL (ref 150–400)
RBC: 3.83 MIL/uL — ABNORMAL LOW (ref 3.87–5.11)
RDW: 16.4 % — ABNORMAL HIGH (ref 11.5–15.5)
WBC: 5.8 10*3/uL (ref 4.0–10.5)

## 2013-06-23 LAB — COMPREHENSIVE METABOLIC PANEL
ALT: 19 U/L (ref 0–35)
AST: 20 U/L (ref 0–37)
Albumin: 4.2 g/dL (ref 3.5–5.2)
Alkaline Phosphatase: 100 U/L (ref 39–117)
BUN: 14 mg/dL (ref 6–23)
CO2: 25 mEq/L (ref 19–32)
Calcium: 9.2 mg/dL (ref 8.4–10.5)
Chloride: 103 mEq/L (ref 96–112)
Creat: 0.75 mg/dL (ref 0.50–1.10)
Glucose, Bld: 111 mg/dL — ABNORMAL HIGH (ref 70–99)
Potassium: 4.2 mEq/L (ref 3.5–5.3)
Sodium: 134 mEq/L — ABNORMAL LOW (ref 135–145)
Total Bilirubin: 0.5 mg/dL (ref 0.3–1.2)
Total Protein: 6.8 g/dL (ref 6.0–8.3)

## 2013-06-23 LAB — LIPID PANEL
Cholesterol: 113 mg/dL (ref 0–200)
HDL: 28 mg/dL — ABNORMAL LOW (ref >39)
LDL Cholesterol: 27 mg/dL (ref 0–99)
Total CHOL/HDL Ratio: 4 Ratio
Triglycerides: 291 mg/dL — ABNORMAL HIGH (ref <150)
VLDL: 58 mg/dL — ABNORMAL HIGH (ref 0–40)

## 2013-06-23 LAB — TSH: TSH: 1.347 u[IU]/mL (ref 0.350–4.500)

## 2013-06-23 NOTE — Telephone Encounter (Signed)
Per office note 06/22/13 neurology appointment  Referral form faxed to [facility]. I will wait to here from this office regarding appointment time and date.

## 2013-06-24 LAB — FOLLICLE STIMULATING HORMONE: FSH: 15.4 m[IU]/mL

## 2013-06-24 LAB — VITAMIN D 25 HYDROXY (VIT D DEFICIENCY, FRACTURES): Vit D, 25-Hydroxy: 29 ng/mL — ABNORMAL LOW (ref 30–89)

## 2013-06-25 ENCOUNTER — Encounter: Payer: Self-pay | Admitting: Gynecology

## 2013-06-28 ENCOUNTER — Other Ambulatory Visit: Payer: Self-pay | Admitting: Gynecology

## 2013-06-28 DIAGNOSIS — E781 Pure hyperglyceridemia: Secondary | ICD-10-CM

## 2013-07-03 NOTE — Telephone Encounter (Signed)
Pt said [facility] healthcare did call her and regarding referral and appointment will be made.

## 2013-08-03 ENCOUNTER — Other Ambulatory Visit: Payer: Self-pay

## 2013-10-27 ENCOUNTER — Emergency Department (HOSPITAL_COMMUNITY)
Admission: EM | Admit: 2013-10-27 | Discharge: 2013-10-27 | Disposition: A | Discharge status: Home / Self Care | Payer: Medicaid Other | Source: Home / Self Care | Attending: Family Medicine | Admitting: Family Medicine

## 2013-10-27 ENCOUNTER — Encounter (HOSPITAL_COMMUNITY): Payer: Self-pay | Admitting: Emergency Medicine

## 2013-10-27 ENCOUNTER — Emergency Department (INDEPENDENT_AMBULATORY_CARE_PROVIDER_SITE_OTHER): Payer: Medicaid Other

## 2013-10-27 DIAGNOSIS — S93409A Sprain of unspecified ligament of unspecified ankle, initial encounter: Secondary | ICD-10-CM

## 2013-10-27 DIAGNOSIS — S93609A Unspecified sprain of unspecified foot, initial encounter: Secondary | ICD-10-CM

## 2013-10-27 DIAGNOSIS — S93401A Sprain of unspecified ligament of right ankle, initial encounter: Secondary | ICD-10-CM

## 2013-10-27 DIAGNOSIS — S93601A Unspecified sprain of right foot, initial encounter: Secondary | ICD-10-CM

## 2013-10-27 NOTE — ED Provider Notes (Signed)
Medical screening examination/treatment/procedure(s) were performed by resident physician or non-physician practitioner and as supervising physician I was immediately available for consultation/collaboration.   KINDL,JAMES DOUGLAS MD.   James D Kindl, MD 10/27/13 0944 

## 2013-10-27 NOTE — ED Provider Notes (Signed)
CSN: [id number]     Arrival date & time 10/27/13  0825 History   None    Chief Complaint  Patient presents with  . Foot Pain   (Consider location/radiation/quality/duration/timing/severity/associated sxs/prior Treatment) Patient is a 48 y.o. female presenting with lower extremity pain. The history is provided by the patient.  Foot Pain This is a new problem. The current episode started 2 days ago. The problem occurs constantly. The problem has been gradually worsening.   Krystal Barber is a 48 y.o. female who presents to the ED with right foot/ankle pain that started 2 days ago. She states she was standing and felt a pop in the lateral aspect of the ankle near the foot. She has been elevating the area, taking aleve and applying ice. She continues to have pain in the area. She denies any other problems or injuries.    Past Medical History  Diagnosis Date  . 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 Gestational diabetes   . GERD (gastroesophageal reflux disease)   . Internal hemorrhoids   . Anemia   . Anxiety and depression    Past Surgical History  Procedure Laterality Date  . Tubal ligation  2004  . Cholecystectomy  11/1994  . Pelvic laparoscopy  2000    RIGHT OVARIAN CYSTECTOMY  . Gynecologic cryosurgery  1992  . Hand surgery      left   Family History  Problem Relation Age of Onset  . Diabetes Mother   . Colon cancer Neg Hx    History  Substance Use Topics  . Smoking status: Former Games developer  . Smokeless tobacco: Never Used  . Alcohol Use: No   OB History   Grav Para Term Preterm Abortions TAB SAB Ect Mult Living   6 4 4  2  2   4      Review of Systems Negative except as stated in HPI  Allergies  Acetaminophen and Benzonatate  Home Medications   Current Outpatient Rx  Name  Route  Sig  Dispense  Refill  . EXPIRED: clonazePAM (KLONOPIN) 0.5 MG tablet   Oral   Take 1 tablet (0.5 mg total) by mouth 2 (two) times daily as needed for  anxiety.   30 tablet   0   . dextromethorphan-guaiFENesin (MUCINEX DM) 30-600 MG per 12 hr tablet      1-2 twice daily per cough protocol         . EXPIRED: esomeprazole (NEXIUM) 40 MG capsule   Oral   Take 1 capsule (40 mg total) by mouth 2 (two) times daily.   60 capsule   1   . sertraline (ZOLOFT) 25 MG tablet   Oral   Take 25 mg by mouth daily.         Marland Kitchen EXPIRED: sucralfate (CARAFATE) 1 G tablet   Oral   Take 1 tablet (1 g total) by mouth 2 (two) times daily.   60 tablet   1   . trazodone (DESYREL) 300 MG tablet   Oral   Take 300 mg by mouth at bedtime.          BP 144/79  Pulse 76  Temp(Src) 97.9 F (36.6 C) (Oral)  Resp 16  SpO2 100%  LMP 09/19/2013 Physical Exam  Nursing note and vitals reviewed. Constitutional: She is oriented to person, place, and time. She appears well-developed and well-nourished. No distress.  HENT:  Head: Atraumatic.  Eyes: EOM are normal.  Neck:  Neck supple.  Pulmonary/Chest: Effort normal.  Abdominal: Soft. There is no tenderness.  Musculoskeletal: Normal range of motion.       Right ankle: She exhibits normal range of motion, no ecchymosis, no deformity, no laceration and normal pulse. Swelling: minimal. Tenderness. Achilles tendon normal.       Feet:  Neurological: She is alert and oriented to person, place, and time. She has normal strength. No cranial nerve deficit or sensory deficit. Abnormal gait: due to pain.  Skin: Skin is warm and dry.  Psychiatric: She has a normal mood and affect. Her behavior is normal.    ED Course: Dr. Artis FlockKindl reviewed the x-ray and sees no abnormality.   Procedures  MDM  48 y.o. female with pain in the lateral aspect of the right foot and ankle x 2 days. ASO applied. She will continue to ice and elevate. She will follow up with her PCP or return as needed for worsening symptoms. I have reviewed this patient's vital signs, nurses notes and discussed clinical findings and plan of care with the  patient. She voces understanding.   [id number]Hope Newman GroveM Neese, [place]NP 10/27/13 [phone]

## 2013-10-27 NOTE — ED Notes (Signed)
C/o foot pain x 2 days.  Denies injury.  States "while standing still felt pop in foot and having pain since"  Mild relief with ibuprofen.

## 2013-10-27 NOTE — Discharge Instructions (Signed)
Continue to ice, elevate and take the ibuprofen as need. Wear the splint and tennis shoes for comfort. Follow up with your doctor or return here as needed.

## 2014-01-01 ENCOUNTER — Telehealth: Payer: Self-pay | Admitting: *Deleted

## 2014-01-01 NOTE — Telephone Encounter (Signed)
Krystal [facility] a one time courtesy NPI number to see this patient this ONE TIME.  They will give patient the message that he needs to have his Medicaid card changed.  [name], Darlyne RussianKristen L, CMA

## 2014-01-04 ENCOUNTER — Other Ambulatory Visit: Payer: Self-pay | Admitting: Gastroenterology

## 2014-01-04 DIAGNOSIS — R1310 Dysphagia, unspecified: Secondary | ICD-10-CM

## 2014-01-09 ENCOUNTER — Ambulatory Visit
Admission: RE | Admit: 2014-01-09 | Discharge: 2014-01-09 | Disposition: A | Discharge status: Home / Self Care | Payer: Medicaid Other | Source: Ambulatory Visit | Attending: Gastroenterology | Admitting: Gastroenterology

## 2014-01-09 DIAGNOSIS — R1310 Dysphagia, unspecified: Secondary | ICD-10-CM

## 2014-01-31 ENCOUNTER — Other Ambulatory Visit: Payer: Self-pay | Admitting: Gastroenterology

## 2014-06-09 ENCOUNTER — Observation Stay (HOSPITAL_COMMUNITY)
Admission: EM | Admit: 2014-06-09 | Discharge: 2014-06-10 | Disposition: A | Discharge status: Home / Self Care | Payer: Medicaid Other | Attending: Internal Medicine | Admitting: Internal Medicine

## 2014-06-09 ENCOUNTER — Emergency Department (HOSPITAL_COMMUNITY): Payer: Medicaid Other

## 2014-06-09 ENCOUNTER — Encounter (HOSPITAL_COMMUNITY): Payer: Self-pay | Admitting: Emergency Medicine

## 2014-06-09 DIAGNOSIS — R059 Cough, unspecified: Secondary | ICD-10-CM

## 2014-06-09 DIAGNOSIS — R51 Headache: Principal | ICD-10-CM | POA: Insufficient documentation

## 2014-06-09 DIAGNOSIS — R202 Paresthesia of skin: Secondary | ICD-10-CM

## 2014-06-09 DIAGNOSIS — R05 Cough: Secondary | ICD-10-CM

## 2014-06-09 DIAGNOSIS — D649 Anemia, unspecified: Secondary | ICD-10-CM | POA: Insufficient documentation

## 2014-06-09 DIAGNOSIS — F419 Anxiety disorder, unspecified: Secondary | ICD-10-CM

## 2014-06-09 DIAGNOSIS — R209 Unspecified disturbances of skin sensation: Secondary | ICD-10-CM | POA: Diagnosis not present

## 2014-06-09 DIAGNOSIS — F3289 Other specified depressive episodes: Secondary | ICD-10-CM | POA: Insufficient documentation

## 2014-06-09 DIAGNOSIS — Z885 Allergy status to narcotic agent status: Secondary | ICD-10-CM | POA: Diagnosis not present

## 2014-06-09 DIAGNOSIS — R6881 Early satiety: Secondary | ICD-10-CM

## 2014-06-09 DIAGNOSIS — R5383 Other fatigue: Secondary | ICD-10-CM

## 2014-06-09 DIAGNOSIS — F411 Generalized anxiety disorder: Secondary | ICD-10-CM | POA: Insufficient documentation

## 2014-06-09 DIAGNOSIS — N951 Menopausal and female climacteric states: Secondary | ICD-10-CM

## 2014-06-09 DIAGNOSIS — R1013 Epigastric pain: Secondary | ICD-10-CM

## 2014-06-09 DIAGNOSIS — R5381 Other malaise: Secondary | ICD-10-CM | POA: Insufficient documentation

## 2014-06-09 DIAGNOSIS — F329 Major depressive disorder, single episode, unspecified: Secondary | ICD-10-CM | POA: Insufficient documentation

## 2014-06-09 DIAGNOSIS — Z888 Allergy status to other drugs, medicaments and biological substances status: Secondary | ICD-10-CM | POA: Insufficient documentation

## 2014-06-09 DIAGNOSIS — R200 Anesthesia of skin: Secondary | ICD-10-CM | POA: Diagnosis present

## 2014-06-09 DIAGNOSIS — K219 Gastro-esophageal reflux disease without esophagitis: Secondary | ICD-10-CM | POA: Diagnosis not present

## 2014-06-09 DIAGNOSIS — E785 Hyperlipidemia, unspecified: Secondary | ICD-10-CM | POA: Diagnosis not present

## 2014-06-09 DIAGNOSIS — G4700 Insomnia, unspecified: Secondary | ICD-10-CM

## 2014-06-09 DIAGNOSIS — E876 Hypokalemia: Secondary | ICD-10-CM | POA: Insufficient documentation

## 2014-06-09 DIAGNOSIS — R519 Headache, unspecified: Secondary | ICD-10-CM | POA: Diagnosis present

## 2014-06-09 DIAGNOSIS — G2581 Restless legs syndrome: Secondary | ICD-10-CM

## 2014-06-09 DIAGNOSIS — K648 Other hemorrhoids: Secondary | ICD-10-CM

## 2014-06-09 NOTE — ED Provider Notes (Signed)
MSE was initiated and I personally evaluated the patient and placed orders (if any) at  10:08 PM on June 09, 2014.  The patient appears stable so that the remainder of the MSE may be completed by another provider.  Briefly, the pt is 48 y.o. female with ho anxiety presenting with concern for CVA symptoms of R sided numbness without motor deficit.  On my initial examination of the patient she had a very questionable right grip weakness compared to left, however had no other focal neurologic deficits on exam Including no gait instability.   Her primary complaint was a mild headache which began earlier in the day. Although she states that her numbness began approximately 3 hours prior to arrival on repeat questioning the patient has had mild symptoms throughout the entirety of the day. The rest of her physical exam including a cardiac, respiratory, and GI exam all unremarkable .    Mirian Mo, MD 06/09/14 [phone]

## 2014-06-09 NOTE — ED Notes (Signed)
The pt was assessed in the triage room by dr Littie Deeds.  He has ordered a head c-t

## 2014-06-09 NOTE — Consult Note (Addendum)
Reason for Consult:Headache Referring Physician: Rubin Payor  CC: Headache  HPI: Krystal Barber is an 48 y.o. female who reports that this morning she awakened with sharp headaches on the right side of her head.  The pain is intermittent lasting 1-2 minutes and occurring every 15 to 20 minutes.  She then noted that she had generalized weakness.  She had some drooling from the left side of the mouth at about 0900 and 1400. She does not recall any drooping of the mouth.  She also reports right sided neck pain and numbness involving the entire right arm that started at about 1900.  This has improved to just numbness of the last two fingers on the right hand.   The patient has headaches but never any that have been associated with her presenting symptoms.    Past Medical History  Diagnosis Date  . 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 Gestational diabetes   . GERD (gastroesophageal reflux disease)   . Internal hemorrhoids   . Anemia   . Anxiety and depression     Past Surgical History  Procedure Laterality Date  . Tubal ligation  2004  . Cholecystectomy  11/1994  . Pelvic laparoscopy  2000    RIGHT OVARIAN CYSTECTOMY  . Gynecologic cryosurgery  1992  . Hand surgery      left    Family History  Problem Relation Age of Onset  . Diabetes Mother   . Colon cancer Neg Hx     Social History:  reports that she has quit smoking. She has never used smokeless tobacco. She reports that she does not drink alcohol or use illicit drugs.  Allergies  Allergen Reactions  . Acetaminophen     SEIZURE LIKE SX'S  . Benzonatate [Tessalon Perles]     Chills, shaking, SOB, chest tightness    Medications: I have reviewed the patient's current medications. Prior to Admission:  Current outpatient prescriptions:ibuprofen (ADVIL,MOTRIN) 200 MG tablet, Take 400 mg by mouth every 6 (six) hours as needed., Disp: , Rfl:   ROS: History obtained from the patient  General ROS: as  noted in HPI Psychological ROS: negative for - behavioral disorder, hallucinations, memory difficulties, mood swings or suicidal ideation Ophthalmic ROS: negative for - blurry vision, double vision, eye pain or loss of vision ENT ROS: negative for - epistaxis, nasal discharge, oral lesions, sore throat, tinnitus or vertigo Allergy and Immunology ROS: negative for - hives or itchy/watery eyes Hematological and Lymphatic ROS: negative for - bleeding problems, bruising or swollen lymph nodes Endocrine ROS: negative for - galactorrhea, hair pattern changes, polydipsia/polyuria or temperature intolerance Respiratory ROS: negative for - cough, hemoptysis, shortness of breath or wheezing Cardiovascular ROS: negative for - chest pain, dyspnea on exertion, edema or irregular heartbeat Gastrointestinal ROS: negative for - abdominal pain, diarrhea, hematemesis, nausea/vomiting or stool incontinence Genito-Urinary ROS: negative for - dysuria, hematuria, incontinence or urinary frequency/urgency Musculoskeletal ROS: as noted in HPI Neurological ROS: as noted in HPI Dermatological ROS: negative for rash and skin lesion changes  Physical Examination: Blood pressure 143/78, pulse 100, temperature 99.1 F (37.3 C), resp. rate 18, height  (1.626 m), weight 102.513 kg (226 lb), last menstrual period 06/09/2014, SpO2 99.00%.  Neurologic Examination Mental Status: Alert, oriented, thought content appropriate.  Speech fluent without evidence of aphasia.  Able to follow 3 step commands without difficulty. Cranial Nerves: II: Discs flat bilaterally; Visual fields grossly normal, pupils equal, round, reactive to light  and accommodation III,IV, VI: ptosis not present, extra-ocular motions intact bilaterally V,VII: smile symmetric, facial light touch sensation decreased on the left VIII: hearing normal bilaterally IX,X: gag reflex present XI: bilateral shoulder shrug XII: midline tongue  extension Motor: Right : Upper extremity   5/5, no drift with 5-/5 hand grip   Left:     Upper extremity   5/5  Lower extremity   5/5        Lower extremity   5/5 Tone and bulk:normal tone throughout; no atrophy noted Sensory: Pinprick and light touch decreased in the 4th and 5th digits on the right hand and lateral portion of there arm, lateral portion of the right leg.   Deep Tendon Reflexes: 2+ and symmetric throughout Plantars: Right: downgoing   Left: downgoing Cerebellar: normal finger-to-nose and normal heel-to-shin testing bilaterally CV: pulses palpable throughout     Laboratory Studies:   Basic Metabolic Panel: No results found for this basename: NA, K, CL, CO2, GLUCOSE, BUN, CREATININE, CALCIUM, MG, PHOS,  in the last 168 hours  Liver Function Tests: No results found for this basename: AST, ALT, ALKPHOS, BILITOT, PROT, ALBUMIN,  in the last 168 hours No results found for this basename: LIPASE, AMYLASE,  in the last 168 hours No results found for this basename: AMMONIA,  in the last 168 hours  CBC: No results found for this basename: WBC, NEUTROABS, HGB, HCT, MCV, PLT,  in the last 168 hours  Cardiac Enzymes: No results found for this basename: CKTOTAL, CKMB, CKMBINDEX, TROPONINI,  in the last 168 hours  BNP: No components found with this basename: POCBNP,   CBG: No results found for this basename: GLUCAP,  in the last 168 hours  Microbiology: No results found for this or any previous visit.  Coagulation Studies: No results found for this basename: LABPROT, INR,  in the last 72 hours  Urinalysis: No results found for this basename: COLORURINE, APPERANCEUR, LABSPEC, PHURINE, GLUCOSEU, HGBUR, BILIRUBINUR, KETONESUR, PROTEINUR, UROBILINOGEN, NITRITE, LEUKOCYTESUR,  in the last 168 hours  Lipid Panel:     Component Value Date/Time   CHOL 113 06/23/2013 0839   TRIG 291* 06/23/2013 0839   HDL 28* 06/23/2013 0839   CHOLHDL 4.0 06/23/2013 0839   VLDL 58* 06/23/2013  0839   LDLCALC 27 06/23/2013 0839    HgbA1C:  Lab Results  Component Value Date   HGBA1C 4.6 11/05/2011    Urine Drug Screen:   No results found for this basename: labopia, cocainscrnur, labbenz, amphetmu, thcu, labbarb    Alcohol Level: No results found for this basename: ETH,  in the last 168 hours   Imaging: Ct Head Wo Contrast  06/09/2014   CLINICAL DATA:  Headache and right arm numbness.  EXAM: CT HEAD WITHOUT CONTRAST  TECHNIQUE: Contiguous axial images were obtained from the base of the skull through the vertex without intravenous contrast.  COMPARISON:  MRI brain 02/11/2004  FINDINGS: The ventricles are normal in size and configuration. No extra-axial fluid collections are identified. The gray-white differentiation is normal. No CT findings for acute intracranial process such as hemorrhage or infarction. No mass lesions. Dural calcifications are noted anteriorly. The brainstem and cerebellum are grossly normal.  The bony structures are intact. The paranasal sinuses and mastoid air cells are clear. The globes are intact.  IMPRESSION: No acute intracranial findings or mass lesions.   Electronically Signed   By: Loralie Champagne M.D.   On: 06/09/2014 20:46     Assessment/Plan: 48 year old female presenting  with headache and various neurological symptoms that are not referable to one vascular territory.  Head CT reviewed and shows no acute changes.  Unclear etiology.  Can not rule out a migraine variant versus a possible demyelinating disorder.  Patient not back to baseline and further work up recommended.  Recommendations: 1.  MRI of the brain with and without contrast  Thana Farr, MD [facility] [phone] 06/09/2014, 11:16 PM

## 2014-06-09 NOTE — ED Notes (Signed)
The pt woke up  With a headache this am and some rt neck pain.  approx one hour ago she had rt arm numbness and rt hand numbness .  She fells weak all over over her body.   No facial  Droop at present.   She had drooling from the lt side of her face and drooling again around 1400.   No arm drift

## 2014-06-10 ENCOUNTER — Observation Stay (HOSPITAL_COMMUNITY): Payer: Medicaid Other

## 2014-06-10 DIAGNOSIS — F411 Generalized anxiety disorder: Secondary | ICD-10-CM

## 2014-06-10 DIAGNOSIS — R519 Headache, unspecified: Secondary | ICD-10-CM | POA: Diagnosis present

## 2014-06-10 DIAGNOSIS — R51 Headache: Secondary | ICD-10-CM

## 2014-06-10 DIAGNOSIS — R202 Paresthesia of skin: Secondary | ICD-10-CM

## 2014-06-10 DIAGNOSIS — R200 Anesthesia of skin: Secondary | ICD-10-CM | POA: Diagnosis present

## 2014-06-10 LAB — CBC WITH DIFFERENTIAL/PLATELET
Basophils Absolute: 0 10*3/uL (ref 0.0–0.1)
Basophils Absolute: 0 10*3/uL (ref 0.0–0.1)
Basophils Relative: 0 % (ref 0–1)
Basophils Relative: 0 % (ref 0–1)
Eosinophils Absolute: 0.1 10*3/uL (ref 0.0–0.7)
Eosinophils Absolute: 0.1 10*3/uL (ref 0.0–0.7)
Eosinophils Relative: 1 % (ref 0–5)
Eosinophils Relative: 1 % (ref 0–5)
HCT: 33.9 % — ABNORMAL LOW (ref 36.0–46.0)
HCT: 33.9 % — ABNORMAL LOW (ref 36.0–46.0)
Hemoglobin: 10.9 g/dL — ABNORMAL LOW (ref 12.0–15.0)
Hemoglobin: 11.2 g/dL — ABNORMAL LOW (ref 12.0–15.0)
Lymphocytes Relative: 25 % (ref 12–46)
Lymphocytes Relative: 26 % (ref 12–46)
Lymphs Abs: 2.2 10*3/uL (ref 0.7–4.0)
Lymphs Abs: 2.7 10*3/uL (ref 0.7–4.0)
MCH: 26 pg (ref 26.0–34.0)
MCH: 26.6 pg (ref 26.0–34.0)
MCHC: 32.2 g/dL (ref 30.0–36.0)
MCHC: 33 g/dL (ref 30.0–36.0)
MCV: 80.5 fL (ref 78.0–100.0)
MCV: 80.9 fL (ref 78.0–100.0)
Monocytes Absolute: 0.5 10*3/uL (ref 0.1–1.0)
Monocytes Absolute: 0.6 10*3/uL (ref 0.1–1.0)
Monocytes Relative: 5 % (ref 3–12)
Monocytes Relative: 6 % (ref 3–12)
Neutro Abs: 6 10*3/uL (ref 1.7–7.7)
Neutro Abs: 7.3 10*3/uL (ref 1.7–7.7)
Neutrophils Relative %: 68 % (ref 43–77)
Neutrophils Relative %: 68 % (ref 43–77)
Platelets: 256 10*3/uL (ref 150–400)
Platelets: 257 10*3/uL (ref 150–400)
RBC: 4.19 MIL/uL (ref 3.87–5.11)
RBC: 4.21 MIL/uL (ref 3.87–5.11)
RDW: 16.3 % — ABNORMAL HIGH (ref 11.5–15.5)
RDW: 16.4 % — ABNORMAL HIGH (ref 11.5–15.5)
WBC: 10.7 10*3/uL — ABNORMAL HIGH (ref 4.0–10.5)
WBC: 8.8 10*3/uL (ref 4.0–10.5)

## 2014-06-10 LAB — BASIC METABOLIC PANEL
Anion gap: 16 — ABNORMAL HIGH (ref 5–15)
BUN: 17 mg/dL (ref 6–23)
CO2: 20 mEq/L (ref 19–32)
Calcium: 9.2 mg/dL (ref 8.4–10.5)
Chloride: 99 mEq/L (ref 96–112)
Creatinine, Ser: 0.72 mg/dL (ref 0.50–1.10)
GFR calc Af Amer: >90 mL/min (ref >90)
GFR calc non Af Amer: >90 mL/min (ref >90)
Glucose, Bld: 116 mg/dL — ABNORMAL HIGH (ref 70–99)
Potassium: 4.9 mEq/L (ref 3.7–5.3)
Sodium: 135 mEq/L — ABNORMAL LOW (ref 137–147)

## 2014-06-10 LAB — I-STAT CHEM 8, ED
BUN: 17 mg/dL (ref 6–23)
Calcium, Ion: 1.16 mmol/L (ref 1.12–1.23)
Chloride: 104 mEq/L (ref 96–112)
Creatinine, Ser: 0.9 mg/dL (ref 0.50–1.10)
Glucose, Bld: 122 mg/dL — ABNORMAL HIGH (ref 70–99)
HCT: 35 % — ABNORMAL LOW (ref 36.0–46.0)
Hemoglobin: 11.9 g/dL — ABNORMAL LOW (ref 12.0–15.0)
Potassium: 3.6 mEq/L — ABNORMAL LOW (ref 3.7–5.3)
Sodium: 137 mEq/L (ref 137–147)
TCO2: 23 mmol/L (ref 0–100)

## 2014-06-10 LAB — HEMOGLOBIN A1C
Hgb A1c MFr Bld: 4.8 % (ref <5.7)
Mean Plasma Glucose: 91 mg/dL (ref <117)

## 2014-06-10 MED ORDER — ENOXAPARIN SODIUM 40 MG/0.4ML ~~LOC~~ SOLN
40.0000 mg | SUBCUTANEOUS | Status: DC
  Filled 2014-06-10: qty 0.4

## 2014-06-10 MED ORDER — SODIUM CHLORIDE 0.9 % IJ SOLN: 3.0000 mL | INTRAMUSCULAR | Status: DC | PRN

## 2014-06-10 MED ORDER — SODIUM CHLORIDE 0.9 % IJ SOLN
3.0000 mL | Freq: Two times a day (BID) | INTRAMUSCULAR | Status: DC
  Administered 2014-06-10: 3 mL via INTRAVENOUS

## 2014-06-10 MED ORDER — ONDANSETRON HCL 4 MG/2ML IJ SOLN: 4.0000 mg | Freq: Four times a day (QID) | INTRAMUSCULAR | Status: DC | PRN

## 2014-06-10 MED ORDER — ONDANSETRON HCL 4 MG PO TABS: 4.0000 mg | ORAL_TABLET | Freq: Four times a day (QID) | ORAL | Status: DC | PRN

## 2014-06-10 MED ORDER — SODIUM CHLORIDE 0.9 % IV SOLN: 250.0000 mL | INTRAVENOUS | Status: DC | PRN

## 2014-06-10 MED ORDER — GADOBENATE DIMEGLUMINE 529 MG/ML IV SOLN
20.0000 mL | Freq: Once | INTRAVENOUS | Status: AC | PRN
  Administered 2014-06-10: 20 mL via INTRAVENOUS

## 2014-06-10 MED ORDER — IBUPROFEN 400 MG PO TABS
400.0000 mg | ORAL_TABLET | Freq: Four times a day (QID) | ORAL | Status: DC | PRN
  Filled 2014-06-10: qty 1

## 2014-06-10 NOTE — H&P (Signed)
[facility] History and Physical  Krystal Barber ZOX:[id number] DOB: 14-Apr-1966 DOA: 06/09/2014  PCP: Thora Lance, MD   Chief Complaint: Headache, numbness and tingling of right hand for one day duration.  HPI: Krystal Barber is a 48 y.o. female with history of GERD, anxiety and depression, chronic anemia who was in her usual state of health until early this morning when she developed right-sided headache, intermittent, 6/10 in intensity lasting for one to 2 minutes each time. She also developed numbness to the right side of the face, and the neck, then she slowly developed numbness, weakness and tingling to the right hand, all of these symptoms have resolved completely at this time. She also remembered drooling from the left side of her mouth with associated difficulty speaking for a brief period. She said she gets headache only occasionally in the past amenable to ibuprofen over-the-counter. She has no chronic medical condition, she's not on any medication except for occasional ibuprofen. She denies constipation, no nausea or vomiting, no blurry vision.   General: The patient denies anorexia, fever, weight loss Cardiac: Denies chest pain, syncope, palpitations, pedal edema  Respiratory: Denies cough, shortness of breath, wheezing GI: Denies severe indigestion/heartburn, abdominal pain, nausea, vomiting, diarrhea and constipation GU: Denies hematuria, incontinence, dysuria  Musculoskeletal: Denies arthritis  Skin: Denies suspicious skin lesions  Past Medical History  Diagnosis Date  . 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 Gestational diabetes   . GERD (gastroesophageal reflux disease)   . Internal hemorrhoids   . Anemia   . Anxiety and depression     Past Surgical History  Procedure Laterality Date  . Tubal ligation  2004  . Cholecystectomy  11/1994  . Pelvic laparoscopy  2000    RIGHT OVARIAN CYSTECTOMY  . Gynecologic cryosurgery  1992  .  Hand surgery      left    Social History: She does not smoke cigarette, she does not drink alcohol. Lives at home with 2 children, 64 year old and 66 years old  ADLs intact  Allergies  Allergen Reactions  . Acetaminophen     SEIZURE LIKE SX'S  . Benzonatate [Tessalon Perles]     Chills, shaking, SOB, chest tightness    Family History  Problem Relation Age of Onset  . Diabetes Mother   . Colon cancer Neg Hx       Prior to Admission medications   Medication Sig Start Date End Date Taking? Authorizing Provider  ibuprofen (ADVIL,MOTRIN) 200 MG tablet Take 400 mg by mouth every 6 (six) hours as needed.   Yes Historical Provider, MD     Physical Exam: Filed Vitals:   06/09/14 2300 06/09/14 2315 06/09/14 2330 06/09/14 2345  BP: 131/78 136/64 123/73 113/66  Pulse: 88 83 84 85  Temp:      Resp:      Height:      Weight:      SpO2: 99% 99% 99% 98%     General: Well nourished, anxious looking, not in distress HEENT: Normocephalic and Atraumatic, Mucous membranes pink                PERRLA; EOM intact; No scleral icterus,                 Nares: Patent, Oropharynx: Clear, Fair Dentition                 Neck: FROM, no cervical lymphadenopathy, thyromegaly, carotid bruit or JVD;  Breasts:  deferred CHEST WALL: No tenderness  CHEST: Normal respiration, clear to auscultation bilaterally  HEART: Regular rate and rhythm; no murmurs rubs or gallops  BACK: No kyphosis or scoliosis; no CVA tenderness  ABDOMEN: Positive Bowel Sounds, soft, non-tender; no masses, no organomegaly Rectal Exam: deferred EXTREMITIES: No cyanosis, clubbing, or edema, power is 5 out of 5 in all limbs, no sensory deficit Genitalia: not examined  SKIN:  no rash or ulceration  CNS: Alert and Oriented x 4, Nonfocal exam, CN 2-12 intact  Labs on Admission:  Basic Metabolic Panel:  Recent Labs Lab 06/10/14 0010  NA 137  K 3.6*  CL 104  GLUCOSE 122*  BUN 17  CREATININE 0.90   Liver Function  Tests: No results found for this basename: AST, ALT, ALKPHOS, BILITOT, PROT, ALBUMIN,  in the last 168 hours No results found for this basename: LIPASE, AMYLASE,  in the last 168 hours No results found for this basename: AMMONIA,  in the last 168 hours CBC:  Recent Labs Lab 06/10/14 0002 06/10/14 0010  WBC 10.7*  --   NEUTROABS 7.3  --   HGB 11.2* 11.9*  HCT 33.9* 35.0*  MCV 80.5  --   PLT 257  --    Cardiac Enzymes: No results found for this basename: CKTOTAL, CKMB, CKMBINDEX, TROPONINI,  in the last 168 hours  BNP (last 3 results) No results found for this basename: PROBNP,  in the last 8760 hours CBG: No results found for this basename: GLUCAP,  in the last 168 hours  Radiological Exams on Admission: Ct Head Wo Contrast  06/09/2014   CLINICAL DATA:  Headache and right arm numbness.  EXAM: CT HEAD WITHOUT CONTRAST  TECHNIQUE: Contiguous axial images were obtained from the base of the skull through the vertex without intravenous contrast.  COMPARISON:  MRI brain 02/11/2004  FINDINGS: The ventricles are normal in size and configuration. No extra-axial fluid collections are identified. The gray-white differentiation is normal. No CT findings for acute intracranial process such as hemorrhage or infarction. No mass lesions. Dural calcifications are noted anteriorly. The brainstem and cerebellum are grossly normal.  The bony structures are intact. The paranasal sinuses and mastoid air cells are clear. The globes are intact.  IMPRESSION: No acute intracranial findings or mass lesions.   Electronically Signed   By: Loralie Champagne M.D.   On: 06/09/2014 20:46    EKG: Independently reviewed.  Assessment/Plan Active Problems:   Headache(784.0)   Disturbance of skin sensation   Headache   Numbness and tingling in right hand  Numbness and tingling with weakness and right hand resolved: Neurologist evaluated patient in the ED Recommend MRI of brain with and without contrast Patient will  be admitted for observation, MRI ordered to rule out multiple sclerosis as a differential diagnosis  Neuro check Vital signs per unit protocol Ibuprofen when necessary for headache  If MRI is negative, patient will be discharged home  Consulted: Neurologist  Code Status: Full Family Communication: Discussed findings and reason for hospital admission for observation with mother at the bedside DVT Prophylaxis: Lovenox  Time spent: 45 minutes  Guiseppe Flanagan, MD [facility]  If 7PM-7AM, please contact night-coverage [email or web address] 06/10/2014, 1:00 AM

## 2014-06-10 NOTE — Progress Notes (Signed)
Patient transferred to 4N12 at 0220, A/O X4. Pt oriented to room and equipment. Care plan reviewed, initial assessment performed. NAD noted. Will continue to monitor.

## 2014-06-10 NOTE — Progress Notes (Signed)
Ready for discharge home; instructions given and reviewed; accompanied home by her mother.

## 2014-06-10 NOTE — Progress Notes (Signed)
Utilization review completed.  

## 2014-06-10 NOTE — Progress Notes (Signed)
NEURO HOSPITALIST PROGRESS NOTE   SUBJECTIVE:                                                                                                                        Feels back to her baseline, said that all symptoms that motivated her admission to the hospital are gone. MR brain with and without contrast revealed no acute abnormality. Asymmetric signal intensity in the petrous apex on the right, unchanged from multiple prior studies, most likely benign.  OBJECTIVE:                                                                                                                           Vital signs in last 24 hours: Temp:  [98 F (36.7 C)-99.1 F (37.3 C)] 98.2 F (36.8 C) (09/13 1004) Pulse Rate:  [73-100] 85 (09/13 1004) Resp:  [18] 18 (09/13 1004) BP: (105-143)/(57-78) 112/62 mmHg (09/13 1004) SpO2:  [98 %-100 %] 98 % (09/13 1004) Weight:  [102.513 kg (226 lb)] 102.513 kg (226 lb) (09/12 1959)  Intake/Output from previous day:   Intake/Output this shift:   Nutritional status: General  Past Medical History  Diagnosis Date  . 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 Gestational diabetes   . GERD (gastroesophageal reflux disease)   . Internal hemorrhoids   . Anemia   . Anxiety and depression     Neurologic Exam:  General: Mental Status: Alert, oriented, thought content appropriate.  Speech fluent without evidence of aphasia.  Able to follow 3 step commands without difficulty. Cranial Nerves: II: Discs flat bilaterally; Visual fields grossly normal, pupils equal, round, reactive to light and accommodation III,IV, VI: ptosis not present, extra-ocular motions intact bilaterally V,VII: smile symmetric, facial light touch sensation normal bilaterally VIII: hearing normal bilaterally IX,X: gag reflex present XI: bilateral shoulder shrug XII: midline tongue extension without atrophy or fasciculations  Motor: Right  : Upper extremity   5/5    Left:     Upper extremity   5/5  Lower extremity   5/5     Lower extremity   5/5 Tone and bulk:normal tone throughout; no atrophy noted Sensory: Pinprick and light touch intact throughout, bilaterally Deep Tendon Reflexes:  Right: Upper Extremity   Left: Upper extremity   biceps (C-5 to C-6) 2/4   biceps (C-5 to C-6) 2/4 tricep (C7) 2/4    triceps (C7) 2/4 Brachioradialis (C6) 2/4  Brachioradialis (C6) 2/4  Lower Extremity Lower Extremity  quadriceps (L-2 to L-4) 2/4   quadriceps (L-2 to L-4) 2/4 Achilles (S1) 2/4   Achilles (S1) 2/4  Plantars: Right: downgoing   Left: downgoing Cerebellar: normal finger-to-nose,  normal heel-to-shin test Gait:  No tested    Lab Results: Lab Results  Component Value Date/Time   CHOL 113 06/23/2013  8:39 AM   Lipid Panel No results found for this basename: CHOL, TRIG, HDL, CHOLHDL, VLDL, LDLCALC,  in the last 72 hours  Studies/Results: Ct Head Wo Contrast  06/09/2014   CLINICAL DATA:  Headache and right arm numbness.  EXAM: CT HEAD WITHOUT CONTRAST  TECHNIQUE: Contiguous axial images were obtained from the base of the skull through the vertex without intravenous contrast.  COMPARISON:  MRI brain 02/11/2004  FINDINGS: The ventricles are normal in size and configuration. No extra-axial fluid collections are identified. The gray-white differentiation is normal. No CT findings for acute intracranial process such as hemorrhage or infarction. No mass lesions. Dural calcifications are noted anteriorly. The brainstem and cerebellum are grossly normal.  The bony structures are intact. The paranasal sinuses and mastoid air cells are clear. The globes are intact.  IMPRESSION: No acute intracranial findings or mass lesions.   Electronically Signed   By: Loralie Champagne M.D.   On: 06/09/2014 20:46   Mr Laqueta Jean AV Contrast  06/10/2014   CLINICAL DATA:  Headache. Generalized weakness. Numbness right arm.  EXAM: MRI HEAD WITHOUT AND WITH  CONTRAST  TECHNIQUE: Multiplanar, multiecho pulse sequences of the brain and surrounding structures were obtained without and with intravenous contrast.  CONTRAST:  20mL MULTIHANCE GADOBENATE DIMEGLUMINE 529 MG/ML IV SOLN  COMPARISON:  CT head 06/09/2014. MRI 02/10/2014. CT temporal bone 09/19/2003  FINDINGS: Negative for acute infarct.  Negative for chronic ischemia.  Negative for demyelinating disease. Cerebral white matter is normal. Brainstem and cerebellum are normal.  Negative for intracranial hemorrhage.  No mass or edema.  Postcontrast imaging of the brain reveal no intra-axial enhancing lesions.  Asymmetric signal in the petrous apex on the right. This has been identified on prior studies and is unchanged. This likely represents asymmetric bone marrow in the right petrous apex, or retained secretions. This could be confirmed with additional followup imaging using fat suppression through the temporal bone. This should be done in 24 hr as the patient received gadolinium today. This is unlikely to be significant as it was present on prior studies including a temporal bone CT of 09/19/2003.  IMPRESSION: Negative for acute infarct  Asymmetric signal intensity in the petrous apex on the right, unchanged from multiple prior studies. This is most likely benign. If there is concern of apical petrositis clinically, further MRI evaluation could be performed in 24 hr. Thin section imaging through the temporal bone could be performed pre and postcontrast, using spin echo T1 imaging. Fat suppression T1 prior to contrast should be performed to determine if there is fatty marrow in the petrous apex.   Electronically Signed   By: Marlan Palau M.D.   On: 06/10/2014 11:11    MEDICATIONS  Scheduled: . enoxaparin (LOVENOX) injection  40 mg Subcutaneous Q24H  . sodium chloride  3 mL Intravenous Q12H    ASSESSMENT/PLAN:                                                                                                            48 y/o admitted with new onset right sided HA, generalized weakness, and initial numbness involving the entire right arm that subsequently was localized in the last two fingers on the right hand. Normal neuro-exam and MRI brain without acute abnormality. Asymmetric signal intensity in the petrous apex on the right, unchanged from multiple prior studies, most likely benign, and unrelated to her symptoms. Her syndrome is difficult to localize but she is neurologically asymptomatic at this time and I will recommend discharging patient home today with outpatient neurology follow up in 2-3 weeks or sooner if her symptoms recur. Neurology will sign off.     Wyatt Portela, MD [facility] [phone]  06/10/2014, 11:30 AM

## 2014-06-10 NOTE — ED Provider Notes (Signed)
CSN: [id number]     Arrival date & time 06/09/14  1935 History   First MD Initiated Contact with Patient 06/09/14 2149     Chief Complaint  Patient presents with  . Headache     (Consider location/radiation/quality/duration/timing/severity/associated sxs/prior Treatment) Patient is a 48 y.o. female presenting with headaches. The history is provided by the patient.  Headache Pain location:  R parietal Associated symptoms: no abdominal pain, no back pain, no diarrhea, no pain, no nausea, no neck stiffness, no numbness and no vomiting   patient developed a right-sided headache today. She states it would come and go last a few minutes. She states that she also had numbness in her right face. She also had another time are her right side for a week. She states she also had left face drooling. She also had difficulty speaking. The symptoms have somewhat, gone throughout the day. She does not tend to get headaches. No confusion. No nausea or vomiting. No vision changes.  Past Medical History  Diagnosis Date  . 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 Gestational diabetes   . GERD (gastroesophageal reflux disease)   . Internal hemorrhoids   . Anemia   . Anxiety and depression    Past Surgical History  Procedure Laterality Date  . Tubal ligation  2004  . Cholecystectomy  11/1994  . Pelvic laparoscopy  2000    RIGHT OVARIAN CYSTECTOMY  . Gynecologic cryosurgery  1992  . Hand surgery      left   Family History  Problem Relation Age of Onset  . Diabetes Mother   . Colon cancer Neg Hx    History  Substance Use Topics  . Smoking status: Former Games developer  . Smokeless tobacco: Never Used  . Alcohol Use: No   OB History   Grav Para Term Preterm Abortions TAB SAB Ect Mult Living   Review of Systems  Constitutional: Negative for activity change and appetite change.  Eyes: Negative for pain.  Respiratory: Negative for chest tightness and shortness  of breath.   Cardiovascular: Negative for chest pain and leg swelling.  Gastrointestinal: Negative for nausea, vomiting, abdominal pain and diarrhea.  Genitourinary: Negative for flank pain.  Musculoskeletal: Negative for back pain and neck stiffness.  Skin: Negative for rash.  Neurological: Positive for speech difficulty, weakness and headaches. Negative for numbness.  Psychiatric/Behavioral: Negative for behavioral problems.      Allergies  Acetaminophen and Benzonatate  Home Medications   Prior to Admission medications   Medication Sig Start Date End Date Taking? Authorizing Provider  ibuprofen (ADVIL,MOTRIN) 200 MG tablet Take 400 mg by mouth every 6 (six) hours as needed.   Yes Historical Provider, MD   BP 113/66  Pulse 85  Temp(Src) 99.1 F (37.3 C)  Resp 18  Ht  (1.626 m)  Wt 226 lb (102.513 kg)  BMI 38.77 kg/m2  SpO2 98%  LMP 06/09/2014 Physical Exam  Nursing note and vitals reviewed. Constitutional: She is oriented to person, place, and time. She appears well-developed and well-nourished.  HENT:  Head: Normocephalic and atraumatic.  Eyes: EOM are normal. Pupils are equal, round, and reactive to light.  Neck: Normal range of motion. Neck supple.  Cardiovascular: Normal rate, regular rhythm and normal heart sounds.   No murmur heard. Pulmonary/Chest: Effort normal and breath sounds normal. No respiratory distress. She has no wheezes. She  has no rales.  Abdominal: Soft. Bowel sounds are normal. She exhibits no distension. There is no tenderness. There is no rebound and no guarding.  Musculoskeletal: Normal range of motion.  Neurological: She is alert and oriented to person, place, and time. No cranial nerve deficit. Coordination normal.  Possible slightly decreased strength on her right upper extremity. Patient states there is some numbness on the right face. Extraocular is intact. Visual fields intact by confrontation. Sensation grossly intact over bilateral  hands, although patient states there is some paresthesias to the right hand. No Romberg. Normal gait.  Skin: Skin is warm and dry.  Psychiatric: She has a normal mood and affect. Her speech is normal.    ED Course  Procedures (including critical care time) Labs Review Labs Reviewed  CBC WITH DIFFERENTIAL - Abnormal; Notable for the following:    WBC 10.7 (*)    Hemoglobin 11.2 (*)    HCT 33.9 (*)    RDW 16.3 (*)    All other components within normal limits  I-STAT CHEM 8, ED - Abnormal; Notable for the following:    Potassium 3.6 (*)    Glucose, Bld 122 (*)    Hemoglobin 11.9 (*)    HCT 35.0 (*)    All other components within normal limits    Imaging Review Ct Head Wo Contrast  06/09/2014   CLINICAL DATA:  Headache and right arm numbness.  EXAM: CT HEAD WITHOUT CONTRAST  TECHNIQUE: Contiguous axial images were obtained from the base of the skull through the vertex without intravenous contrast.  COMPARISON:  MRI brain 02/11/2004  FINDINGS: The ventricles are normal in size and configuration. No extra-axial fluid collections are identified. The gray-white differentiation is normal. No CT findings for acute intracranial process such as hemorrhage or infarction. No mass lesions. Dural calcifications are noted anteriorly. The brainstem and cerebellum are grossly normal.  The bony structures are intact. The paranasal sinuses and mastoid air cells are clear. The globes are intact.  IMPRESSION: No acute intracranial findings or mass lesions.   Electronically Signed   By: Loralie Champagne M.D.   On: 06/09/2014 20:46     EKG Interpretation None      MDM   Final diagnoses:  Headache(784.0)  Disturbance of skin sensation    Patient with headache and various neurologic complaints. Seen by neurology. Doubt TIA or stroke. Per neurology does not need further workup for this, however MS is a possibility. Will get observation and MRI done tomorrow.    Juliet Rude. Rubin Payor, MD 06/10/14 [phone]

## 2014-06-10 NOTE — Discharge Instructions (Signed)

## 2014-06-10 NOTE — Discharge Summary (Signed)
Physician Discharge Summary  SHARMAIN LASTRA GNF:[id number] DOB: 02-12-1966 DOA: 06/09/2014  PCP: Thora Lance, MD  Admit date: 06/09/2014 Discharge date: 06/10/2014  Time spent: 35 minutes  Recommendations for Outpatient Follow-up:  Patient will be discharged home. She is to follow up with her primary care physician within one to 2 weeks of discharge. Patient to continue physical activity as tolerated. She should follow a low fat diet.  Discharge Diagnoses:  Active Problems:   Headache(784.0)   Disturbance of skin sensation   Headache   Numbness and tingling in right hand  Discharge Condition: Sable  Diet recommendation: Low fat diet.  Filed Weights   06/09/14 1959  Weight: 102.513 kg (226 lb)    History of present illness:  On 06/10/2014 Krystal Barber is a 48 y.o. female with history of GERD, anxiety and depression, chronic anemia who was in her usual state of health until she developed right-sided headache, intermittent, 6/10 in intensity lasting for one to 2 minutes each time. She also developed numbness to the right side of the face, and the neck, then she slowly developed numbness, weakness and tingling to the right hand, all of these symptoms have resolved completely at the time of admission. She also remembered drooling from the left side of her mouth with associated difficulty speaking for a brief period. She said she gets headaches only occasionally in the past which were amenable to ibuprofen over-the-counter. She has no chronic medical condition, she is not on any medication except for occasional ibuprofen. She denies constipation, no nausea or vomiting, no blurry vision.  Hospital Course:  Right hand numbness and tingling/Headache -Resolved -CT of the head: No acute intracranial findings or mass lesions.  -MRI of the brain with and without contrast: Negative for acute infarct -Neurology was consulted and appreciated -Possibly secondary to migraine variant    Dyslipidemia -Patient was traumatized and triglycerides were high, she currently is on any medications. -Patient should discuss this with her primary care physician and follow a low fat diet.  Hypokalemia -Replaced, currently 4.9  Chronic anemia -Hemoglobin remained stable  Procedures: None  Consultations:  Neurology  Discharge Exam: Filed Vitals:   06/10/14 1004  BP: 112/62  Pulse: 85  Temp: 98.2 F (36.8 C)  Resp: 18     General: Well developed, well nourished, NAD, appears stated age  HEENT: NCAT, PERRLA, EOMI, Anicteic Sclera, mucous membranes moist.  Neck: Supple, no JVD, no masses  Cardiovascular: S1 S2 auscultated, no rubs, murmurs or gallops. Regular rate and rhythm.  Respiratory: Clear to auscultation bilaterally with equal chest rise  Abdomen: Soft, obese, nontender, nondistended, + bowel sounds  Extremities: warm dry without cyanosis clubbing or edema  Neuro: AAOx3, cranial nerves grossly intact. Strength 5/5 in patient's upper and lower extremities bilaterally  Skin: Without rashes exudates or nodules  Psych: Normal affect and demeanor with intact judgement and insight  Discharge Instructions      Discharge Instructions   Discharge instructions    Complete by:  As directed   Patient will be discharged home. She is to follow up with her primary care physician within one to 2 weeks of discharge. Patient to continue physical activity as tolerated. She should follow a low fat diet.            Medication List         ibuprofen 200 MG tablet  Commonly known as:  ADVIL,MOTRIN  Take 400 mg by mouth every 6 (six) hours as needed.  Allergies  Allergen Reactions  . Acetaminophen     SEIZURE LIKE SX'S  . Benzonatate [Tessalon Perles]     Chills, shaking, SOB, chest tightness   Follow-up Information   Follow up with Thora Lance, MD. Schedule an appointment as soon as possible for a visit in 1 week. [facility] followup)     Specialty:  Family Medicine   Contact information:   [address], [address] [place] [address] [phone]        The results of significant diagnostics from this hospitalization (including imaging, microbiology, ancillary and laboratory) are listed below for reference.    Significant Diagnostic Studies: Ct Head Wo Contrast  06/09/2014   CLINICAL DATA:  Headache and right arm numbness.  EXAM: CT HEAD WITHOUT CONTRAST  TECHNIQUE: Contiguous axial images were obtained from the base of the skull through the vertex without intravenous contrast.  COMPARISON:  MRI brain 02/11/2004  FINDINGS: The ventricles are normal in size and configuration. No extra-axial fluid collections are identified. The gray-white differentiation is normal. No CT findings for acute intracranial process such as hemorrhage or infarction. No mass lesions. Dural calcifications are noted anteriorly. The brainstem and cerebellum are grossly normal.  The bony structures are intact. The paranasal sinuses and mastoid air cells are clear. The globes are intact.  IMPRESSION: No acute intracranial findings or mass lesions.   Electronically Signed   By: Loralie Champagne M.D.   On: 06/09/2014 20:46    Microbiology: No results found for this or any previous visit (from the past 240 hour(s)).   Labs: Basic Metabolic Panel:  Recent Labs Lab 06/10/14 0010 06/10/14 0754  NA 137 135*  K 3.6* 4.9  CL 104 99  CO2  --  20  GLUCOSE 122* 116*  BUN 17 17  CREATININE 0.90 0.72  CALCIUM  --  9.2   Liver Function Tests: No results found for this basename: AST, ALT, ALKPHOS, BILITOT, PROT, ALBUMIN,  in the last 168 hours No results found for this basename: LIPASE, AMYLASE,  in the last 168 hours No results found for this basename: AMMONIA,  in the last 168 hours CBC:  Recent Labs Lab 06/10/14 0002 06/10/14 0010 06/10/14 0754  WBC 10.7*  --  8.8  NEUTROABS 7.3  --  6.0  HGB 11.2* 11.9* 10.9*  HCT 33.9* 35.0* 33.9*    MCV 80.5  --  80.9  PLT 257  --  256   Cardiac Enzymes: No results found for this basename: CKTOTAL, CKMB, CKMBINDEX, TROPONINI,  in the last 168 hours BNP: BNP (last 3 results) No results found for this basename: PROBNP,  in the last 8760 hours CBG: No results found for this basename: GLUCAP,  in the last 168 hours     Signed:  Edsel Petrin  [facility] 06/10/2014, 11:29 AM

## 2014-06-19 ENCOUNTER — Other Ambulatory Visit: Payer: Self-pay

## 2014-06-19 DIAGNOSIS — Z1231 Encounter for screening mammogram for malignant neoplasm of breast: Secondary | ICD-10-CM

## 2014-07-06 ENCOUNTER — Ambulatory Visit
Admission: RE | Admit: 2014-07-06 | Discharge: 2014-07-06 | Disposition: A | Discharge status: Home / Self Care | Payer: Medicaid Other | Source: Ambulatory Visit

## 2014-07-06 DIAGNOSIS — Z1231 Encounter for screening mammogram for malignant neoplasm of breast: Secondary | ICD-10-CM

## 2014-07-09 ENCOUNTER — Other Ambulatory Visit: Payer: Self-pay | Admitting: Gynecology

## 2014-07-09 DIAGNOSIS — N63 Unspecified lump in unspecified breast: Secondary | ICD-10-CM

## 2014-07-18 ENCOUNTER — Ambulatory Visit
Admission: RE | Admit: 2014-07-18 | Discharge: 2014-07-18 | Disposition: A | Discharge status: Home / Self Care | Payer: Medicaid Other | Source: Ambulatory Visit | Attending: Gynecology | Admitting: Gynecology

## 2014-07-18 DIAGNOSIS — N63 Unspecified lump in unspecified breast: Secondary | ICD-10-CM

## 2014-07-30 ENCOUNTER — Encounter (HOSPITAL_COMMUNITY): Payer: Self-pay | Admitting: Emergency Medicine

## 2014-08-16 ENCOUNTER — Encounter: Payer: Self-pay | Admitting: Gynecology

## 2014-08-16 ENCOUNTER — Ambulatory Visit (INDEPENDENT_AMBULATORY_CARE_PROVIDER_SITE_OTHER): Payer: Medicaid Other | Admitting: Gynecology

## 2014-08-16 VITALS — BP 124/86 | Ht 64.0 in | Wt 219.0 lb

## 2014-08-16 DIAGNOSIS — Z8639 Personal history of other endocrine, nutritional and metabolic disease: Secondary | ICD-10-CM

## 2014-08-16 DIAGNOSIS — Z01419 Encounter for gynecological examination (general) (routine) without abnormal findings: Secondary | ICD-10-CM | POA: Diagnosis not present

## 2014-08-16 DIAGNOSIS — J040 Acute laryngitis: Secondary | ICD-10-CM

## 2014-08-16 DIAGNOSIS — Z0000 Encounter for general adult medical examination without abnormal findings: Secondary | ICD-10-CM | POA: Diagnosis not present

## 2014-08-16 NOTE — Patient Instructions (Addendum)
Patient information: High cholesterol (The Basics)  What is cholesterol? - Cholesterol is a substance that is found in the blood. Everyone has some. It is needed for good health. The problem is, people sometimes have too much cholesterol. Compared with people with normal cholesterol, people with high cholesterol have a higher risk of heart attacks, strokes, and other health problems. The higher your cholesterol, the higher your risk of these problems. Cholesterol levels in your body are determined significantly by your diet. Cholesterol levels may also be related to heart disease. The following material helps to explain this relationship and discusses what you can do to help keep your heart healthy. Not all cholesterol is bad. Low-density lipoprotein (LDL) cholesterol is the "bad" cholesterol. It may cause fatty deposits to build up inside your arteries. High-density lipoprotein (HDL) cholesterol is "good." It helps to remove the "bad" LDL cholesterol from your blood. Cholesterol is a very important risk factor for heart disease. Other risk factors are high blood pressure, smoking, stress, heredity, and weight.  The heart muscle gets its supply of blood through the coronary arteries. If your LDL cholesterol is high and your HDL cholesterol is low, you are at risk for having fatty deposits build up in your coronary arteries. This leaves less room through which blood can flow. Without sufficient blood and oxygen, the heart muscle cannot function properly and you may feel chest pains (angina pectoris). When a coronary artery closes up entirely, a part of the heart muscle may die, causing a heart attack (myocardial infarction).  CHECKING CHOLESTEROL When your caregiver sends your blood to a lab to be analyzed for cholesterol, a complete lipid (fat) profile may be done. With this test, the total amount of cholesterol and levels of LDL and HDL are determined. Triglycerides  are a type of fat that circulates in the blood and can also be used to determine heart disease risk. Are there different types of cholesterol? - Yes, there are a few different types. If you get a cholesterol test, you may hear your doctor or nurse talk about: Total cholesterol  LDL cholesterol - Some people call this the "bad" cholesterol. That's because having high LDL levels raises your risk of heart attacks, strokes, and other health problems.  HDL cholesterol - Some people call this the "good" cholesterol. That's because having high HDL levels lowers your risk of heart attacks, strokes, and other health problems.  Non-HDL cholesterol - Non-HDL cholesterol is your total cholesterol minus your HDL cholesterol.  Triglycerides - Triglycerides are not cholesterol. They are a type of fat. But they often get measured when cholesterol is measured. (Having high triglycerides also seems to increase the risk of heart attacks and strokes.)   Keep in mind, though, that many people who cannot meet these goals still have a low risk of heart attacks and strokes. What should I do if my doctor tells me I have high cholesterol? - Ask your doctor what your overall risk of heart attacks and strokes is. High cholesterol, by itself, is not always a reason to worry. Having high cholesterol is just one of many things that can increase your risk of heart attacks and strokes. Other factors that increase your risk include:  Cigarette smoking  High blood pressure  Having a parent, sister, or brother who got heart disease at a young age (Young, in this case, means younger than 55 for men and younger than 65 for women.)  Being a man (Women are at risk, too, but men   have a higher risk.)  Older age  If you are at high risk of heart attacks and strokes, having high cholesterol is a problem. On the other hand, if you have are at low risk, having high cholesterol may not mean much. Should I take medicine to lower cholesterol? - Not  everyone who has high cholesterol needs medicines. Your doctor or nurse will decide if you need them based on your age, family history, and other health concerns.  You should probably take a cholesterol-lowering medicine called a statin if you: Already had a heart attack or stroke  Have known heart disease  Have diabetes  Have a condition called peripheral artery disease, which makes it painful to walk, and happens when the arteries in your legs get clogged with fatty deposits  Have an abdominal aortic aneurysm, which is a widening of the main artery in the belly  Most people with any of the conditions listed above should take a statin no matter what their cholesterol level is. If your doctor or nurse puts you on a statin, stay on it. The medicine may not make you feel any different. But it can help prevent heart attacks, strokes, and death.  Can I lower my cholesterol without medicines? - Yes, you can lower your cholesterol some by:  Avoiding red meat, butter, fried foods, cheese, and other foods that have a lot of saturated fat  Losing weight (if you are overweight)  Being more active Even if these steps do little to change your cholesterol, they can improve your health in many ways.                                                   Cholesterol Control Diet  CONTROLLING CHOLESTEROL WITH DIET Although exercise and lifestyle factors are important, your diet is key. That is because certain foods are known to raise cholesterol and others to lower it. The goal is to balance foods for their effect on cholesterol and more importantly, to replace saturated and trans fat with other types of fat, such as monounsaturated fat, polyunsaturated fat, and omega-3 fatty acids. On average, a person should consume no more than 15 to 17 g of saturated fat daily. Saturated and trans fats are considered "bad" fats, and they will raise LDL cholesterol. Saturated fats are primarily found in animal products such as  meats, butter, and cream. However, that does not mean you need to sacrifice all your favorite foods. Today, there are good tasting, low-fat, low-cholesterol substitutes for most of the things you like to eat. Choose low-fat or nonfat alternatives. Choose round or loin cuts of red meat, since these types of cuts are lowest in fat and cholesterol. Chicken (without the skin), fish, veal, and ground turkey breast are excellent choices. Eliminate fatty meats, such as hot dogs and salami. Even shellfish have little or no saturated fat. Have a 3 oz (85 g) portion when you eat lean meat, poultry, or fish. Trans fats are also called "partially hydrogenated oils." They are oils that have been scientifically manipulated so that they are solid at room temperature resulting in a longer shelf life and improved taste and texture of foods in which they are added. Trans fats are found in stick margarine, some tub margarines, cookies, crackers, and baked goods.  When baking and cooking, oils are an excellent substitute   for butter. The monounsaturated oils are especially beneficial since it is believed they lower LDL and raise HDL. The oils you should avoid entirely are saturated tropical oils, such as coconut and palm.  Remember to eat liberally from food groups that are naturally free of saturated and trans fat, including fish, fruit, vegetables, beans, grains (barley, rice, couscous, bulgur wheat), and pasta (without cream sauces).  IDENTIFYING FOODS THAT LOWER CHOLESTEROL  Soluble fiber may lower your cholesterol. This type of fiber is found in fruits such as apples, vegetables such as broccoli, potatoes, and carrots, legumes such as beans, peas, and lentils, and grains such as barley. Foods fortified with plant sterols (phytosterol) may also lower cholesterol. You should eat at least 2 g per day of these foods for a cholesterol lowering effect.  Read package labels to identify low-saturated fats, trans fats free, and  low-fat foods at the supermarket. Select cheeses that have only 2 to 3 g saturated fat per ounce. Use a heart-healthy tub margarine that is free of trans fats or partially hydrogenated oil. When buying baked goods (cookies, crackers), avoid partially hydrogenated oils. Breads and muffins should be made from whole grains (whole-wheat or whole oat flour, instead of "flour" or "enriched flour"). Buy non-creamy canned soups with reduced salt and no added fats.  FOOD PREPARATION TECHNIQUES  Never deep-fry. If you must fry, either stir-fry, which uses very little fat, or use non-stick cooking sprays. When possible, broil, bake, or roast meats, and steam vegetables. Instead of dressing vegetables with butter or margarine, use lemon and herbs, applesauce and cinnamon (for squash and sweet potatoes), nonfat yogurt, salsa, and low-fat dressings for salads.  LOW-SATURATED FAT / LOW-FAT FOOD SUBSTITUTES Meats / Saturated Fat (g)  Avoid: Steak, marbled (3 oz/85 g) / 11 g   Choose: Steak, lean (3 oz/85 g) / 4 g   Avoid: Hamburger (3 oz/85 g) / 7 g   Choose: Hamburger, lean (3 oz/85 g) / 5 g   Avoid: Ham (3 oz/85 g) / 6 g   Choose: Ham, lean cut (3 oz/85 g) / 2.4 g   Avoid: Chicken, with skin, dark meat (3 oz/85 g) / 4 g   Choose: Chicken, skin removed, dark meat (3 oz/85 g) / 2 g   Avoid: Chicken, with skin, light meat (3 oz/85 g) / 2.5 g   Choose: Chicken, skin removed, light meat (3 oz/85 g) / 1 g  Dairy / Saturated Fat (g)  Avoid: Whole milk (1 cup) / 5 g   Choose: Low-fat milk, 2% (1 cup) / 3 g   Choose: Low-fat milk, 1% (1 cup) / 1.5 g   Choose: Skim milk (1 cup) / 0.3 g   Avoid: Hard cheese (1 oz/28 g) / 6 g   Choose: Skim milk cheese (1 oz/28 g) / 2 to 3 g   Avoid: Cottage cheese, 4% fat (1 cup) / 6.5 g   Choose: Low-fat cottage cheese, 1% fat (1 cup) / 1.5 g   Avoid: Ice cream (1 cup) / 9 g   Choose: Sherbet (1 cup) / 2.5 g   Choose: Nonfat frozen yogurt (1 cup) / 0.3 g    Choose: Frozen fruit bar / trace   Avoid: Whipped cream (1 tbs) / 3.5 g   Choose: Nondairy whipped topping (1 tbs) / 1 g  Condiments / Saturated Fat (g)  Avoid: Mayonnaise (1 tbs) / 2 g   Choose: Low-fat mayonnaise (1 tbs) / 1 g   Avoid:   Butter (1 tbs) / 7 g   Choose: Extra light margarine (1 tbs) / 1 g   Avoid: Coconut oil (1 tbs) / 11.8 g   Choose: Olive oil (1 tbs) / 1.8 g   Choose: Corn oil (1 tbs) / 1.7 g   Choose: Safflower oil (1 tbs) / 1.2 g   Choose: Sunflower oil (1 tbs) / 1.4 g   Choose: Soybean oil (1 tbs) / 2.4 g   Choose: Canola oil (1 tbs) / 1 g  Exercise to Lose Weight Exercise and a healthy diet may help you lose weight. Your doctor may suggest specific exercises. EXERCISE IDEAS AND TIPS Choose low-cost things you enjoy doing, such as walking, bicycling, or exercising to workout videos.  Take stairs instead of the elevator.  Walk during your lunch break.  Park your car further away from work or school.  Go to a gym or an exercise class.  Start with 5 to 10 minutes of exercise each day. Build up to 30 minutes of exercise 4 to 6 days a week.  Wear shoes with good support and comfortable clothes.  Stretch before and after working out.  Work out until you breathe harder and your heart beats faster.  Drink extra water when you exercise.  Do not do so much that you hurt yourself, feel dizzy, or get very short of breath.  Exercises that burn about 150 calories: Running 1  miles in 15 minutes.  Playing volleyball for 45 to 60 minutes.  Washing and waxing a car for 45 to 60 minutes.  Playing touch football for 45 minutes.  Walking 1  miles in 35 minutes.  Pushing a stroller 1  miles in 30 minutes.  Playing basketball for 30 minutes.  Raking leaves for 30 minutes.  Bicycling 5 miles in 30 minutes.  Walking 2 miles in 30 minutes.  Dancing for 30 minutes.  Shoveling snow for 15 minutes.  Swimming laps for 20 minutes.  Walking up stairs for 15  minutes.  Bicycling 4 miles in 15 minutes.  Gardening for 30 to 45 minutes.  Jumping rope for 15 minutes.  Washing windows or floors for 45 to 60 minutes.  Document Released: 10/17/2010 Document Revised: 05/27/2011 Document Reviewed: 10/17/2010 [facility] Patient Information 2012 [place], [place].   Laryngitis At the top of your windpipe is your voice box. It is the source of your voice. Inside your voice box are 2 bands of muscles called vocal cords. When you breathe, your vocal cords are relaxed and open so that air can get into the lungs. When you decide to say something, these cords come together and vibrate. The sound from these vibrations goes into your throat and comes out through your mouth as sound. Laryngitis is an inflammation of the vocal cords that causes hoarseness, cough, loss of voice, sore throat, and dry throat. Laryngitis can be temporary (acute) or long-term (chronic). Most cases of acute laryngitis improve with time.Chronic laryngitis lasts for more than 3 weeks. CAUSES Laryngitis can often be related to excessive smoking, talking, or yelling, as well as inhalation of toxic fumes and allergies. Acute laryngitis is usually caused by a viral infection, vocal strain, measles or mumps, or bacterial infections. Chronic laryngitis is usually caused by vocal cord strain, vocal cord injury, postnasal drip, growths on the vocal cords, or acid reflux. SYMPTOMS  Cough. Sore throat. Dry throat. RISK FACTORS Respiratory infections. Exposure to irritating substances, such as cigarette smoke, excessive amounts of alcohol, stomach acids, and workplace  chemicals. Voice trauma, such as vocal cord injury from shouting or speaking too loud. DIAGNOSIS  Your cargiver will perform a physical exam. During the physical exam, your caregiver will examine your throat. The most common sign of laryngitis is hoarseness. Laryngoscopy may be necessary to confirm the diagnosis of this condition. This  procedure allows your caregiver to look into the larynx. HOME CARE INSTRUCTIONS Drink enough fluids to keep your urine clear or pale yellow. Rest until you no longer have symptoms or as directed by your caregiver. Breathe in moist air. Take all medicine as directed by your caregiver. Do not smoke. Talk as little as possible (this includes whispering). Write on paper instead of talking until your voice is back to normal. Follow up with your caregiver if your condition has not improved after 10 days. SEEK MEDICAL CARE IF:  You have trouble breathing. You cough up blood. You have persistent fever. You have increasing pain. You have difficulty swallowing. MAKE SURE YOU: Understand these instructions. Will watch your condition. Will get help right away if you are not doing well or get worse. Document Released: 09/14/2005 Document Revised: 12/07/2011 Document Reviewed: 11/20/2010 [facility] Patient Information 2015 [place], [place]. This information is not intended to replace advice given to you by your health care provider. Make sure you discuss any questions you have with your health care provider.

## 2014-08-16 NOTE — Progress Notes (Signed)
Krystal Barber [birth year] [id number]   History:    48 y.o.  for annual gyn exam who had been complaining of laryngitis for the past few days. Is started as a cold a few weeks ago. 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 She's had a previous tubal sterilization procedure her cycles are reported to be regular. Patient with a longer taking Zoloft or Klonopin. She's been doing well otherwise. She reports normal menstrual cycles. She denies smoking or alcohol consumption.   Past medical history,surgical history, family history and social history were all reviewed and documented in the EPIC chart.  Gynecologic History Patient's last menstrual period was 08/02/2014. Contraception: tubal ligation Last Pap: 2013. Results were: normal Last mammogram: 2015. Results were: normal  Obstetric History OB History  Gravida Para Term Preterm AB SAB TAB Ectopic Multiple Living  6 4 4  2 2    4     # Outcome Date GA Lbr Len/2nd Weight Sex Delivery Anes PTL Lv  6 SAB           5 SAB           4 Term     M Vag-Spont  N Y  3 Term     M Vag-Spont  N Y  2 Term     M Vag-Spont  N Y  1 Term     M Vag-Spont  N Y       ROS: A ROS was performed and pertinent positives and negatives are included in the history.  GENERAL: No fevers or chills. HEENT: No change in vision, no earache, sore throat or sinus congestion. NECK: No pain or stiffness. CARDIOVASCULAR: No chest pain or pressure. No palpitations. PULMONARY: No shortness of breath, cough or wheeze. GASTROINTESTINAL: No abdominal pain, nausea, vomiting or diarrhea, melena or bright red blood per rectum. GENITOURINARY: No urinary frequency, urgency, hesitancy or dysuria. MUSCULOSKELETAL: No joint or muscle pain, no back pain, no recent trauma. DERMATOLOGIC: No rash, no itching, no lesions. ENDOCRINE: No polyuria, polydipsia,  no heat or cold intolerance. No recent change in weight. HEMATOLOGICAL: No anemia or easy bruising or bleeding. NEUROLOGIC: No headache, seizures, numbness, tingling or weakness. PSYCHIATRIC: No depression, no loss of interest in normal activity or change in sleep pattern.     Exam: chaperone present  BP 124/86 mmHg  Ht 5\' 4"  (1.626 m)  Wt 219 lb (99.338 kg)  BMI 37.57 kg/m2  LMP 08/02/2014  Body mass index is 37.57 kg/(m^2).  General appearance : Well developed well nourished female. No acute distress HEENT: Neck supple, trachea midline, no carotid bruits, no thyroidmegaly Lungs: Clear to auscultation, no rhonchi or wheezes, or rib retractions  Heart: Regular rate and rhythm, no murmurs or gallops Breast:Examined in sitting and supine position were symmetrical in appearance, no palpable masses or tenderness,  no skin retraction, no nipple inversion, no nipple discharge, no skin discoloration, no axillary or supraclavicular lymphadenopathy Abdomen: no palpable masses or tenderness, no rebound or guarding Extremities: no edema or skin discoloration or tenderness  Pelvic:  Bartholin, Urethra, Skene Glands: Within normal limits             Vagina: No gross lesions or discharge  Cervix: No gross lesions or discharge  Uterus  anteverted, normal size, shape and consistency, non-tender and mobile  Adnexa  Without masses or tenderness  Anus  and perineum  normal   Rectovaginal  normal sphincter tone without palpated masses or tenderness             Hemoccult not indicated     Assessment/Plan:  48 y.o. female for annual exam was given literature information on exercise and cholesterol lowering diet. Her PCP has been doing her blood work. Review of her record indicated she's had history vitamin D deficiency in the past. We will check a vitamin D level today. Pap smear not done according to the new guidelines. We discussed importance of calcium vitamin D and regular exercise for osteoporosis  prevention. We discussed importance of monthly self breast examination. Patient declined flu vaccine. Patient was instructed to give her voice a rest and to call with Listerine and warm water for her laryngitis.   Ok EdwardsFERNANDEZ,JUAN H MD, 11:31 AM 08/16/2014

## 2014-08-17 ENCOUNTER — Other Ambulatory Visit: Payer: Self-pay | Admitting: Gynecology

## 2014-08-17 DIAGNOSIS — E559 Vitamin D deficiency, unspecified: Secondary | ICD-10-CM

## 2014-08-17 LAB — VITAMIN D 25 HYDROXY (VIT D DEFICIENCY, FRACTURES): Vit D, 25-Hydroxy: 17 ng/mL — ABNORMAL LOW (ref 30–100)

## 2014-08-17 MED ORDER — VITAMIN D (ERGOCALCIFEROL) 1.25 MG (50000 UNIT) PO CAPS: 50000.0000 [IU] | ORAL_CAPSULE | ORAL | Status: DC

## 2014-09-17 ENCOUNTER — Emergency Department (HOSPITAL_COMMUNITY)
Admission: EM | Admit: 2014-09-17 | Discharge: 2014-09-17 | Disposition: A | Discharge status: Home / Self Care | Payer: Medicaid Other | Source: Home / Self Care | Attending: Emergency Medicine | Admitting: Emergency Medicine

## 2014-09-17 ENCOUNTER — Encounter (HOSPITAL_COMMUNITY): Payer: Self-pay | Admitting: Emergency Medicine

## 2014-09-17 DIAGNOSIS — J9801 Acute bronchospasm: Secondary | ICD-10-CM

## 2014-09-17 DIAGNOSIS — J209 Acute bronchitis, unspecified: Secondary | ICD-10-CM

## 2014-09-17 MED ORDER — METHYLPREDNISOLONE SODIUM SUCC 125 MG IJ SOLR
INTRAMUSCULAR | Status: AC
  Filled 2014-09-17: qty 2

## 2014-09-17 MED ORDER — ALBUTEROL SULFATE HFA 108 (90 BASE) MCG/ACT IN AERS: 2.0000 | INHALATION_SPRAY | RESPIRATORY_TRACT | Status: DC | PRN

## 2014-09-17 MED ORDER — ALBUTEROL SULFATE (2.5 MG/3ML) 0.083% IN NEBU
2.5000 mg | INHALATION_SOLUTION | Freq: Once | RESPIRATORY_TRACT | Status: AC
  Administered 2014-09-17: 2.5 mg via RESPIRATORY_TRACT

## 2014-09-17 MED ORDER — METHYLPREDNISOLONE SODIUM SUCC 125 MG IJ SOLR
80.0000 mg | Freq: Once | INTRAMUSCULAR | Status: AC
  Administered 2014-09-17: 80 mg via INTRAMUSCULAR

## 2014-09-17 MED ORDER — IPRATROPIUM-ALBUTEROL 0.5-2.5 (3) MG/3ML IN SOLN
RESPIRATORY_TRACT | Status: AC
  Filled 2014-09-17: qty 3

## 2014-09-17 MED ORDER — PREDNISONE 20 MG PO TABS: ORAL_TABLET | ORAL | Status: DC

## 2014-09-17 MED ORDER — ALBUTEROL SULFATE (2.5 MG/3ML) 0.083% IN NEBU
INHALATION_SOLUTION | RESPIRATORY_TRACT | Status: AC
  Filled 2014-09-17: qty 3

## 2014-09-17 MED ORDER — IPRATROPIUM-ALBUTEROL 0.5-2.5 (3) MG/3ML IN SOLN
3.0000 mL | Freq: Once | RESPIRATORY_TRACT | Status: AC
  Administered 2014-09-17: 3 mL via RESPIRATORY_TRACT

## 2014-09-17 NOTE — ED Provider Notes (Signed)
CSN: [id number]     Arrival date & time 09/17/14  1240 History   First MD Initiated Contact with Patient 09/17/14 1350     Chief Complaint  Patient presents with  . Cough  . Nasal Congestion   (Consider location/radiation/quality/duration/timing/severity/associated sxs/prior Treatment) HPI Comments: 48 year old female with a cough for one week. Denies PND, runny nose, fever, history of asthma or smoking. She is taking Singulair, Zyrtec and Robitussin-DM without relief.   Past Medical History  Diagnosis Date  . 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 Gestational diabetes   . GERD (gastroesophageal reflux disease)   . Internal hemorrhoids   . Anemia   . Anxiety and depression    Past Surgical History  Procedure Laterality Date  . Tubal ligation  2004  . Cholecystectomy  11/1994  . Pelvic laparoscopy  2000    RIGHT OVARIAN CYSTECTOMY  . Gynecologic cryosurgery  1992  . Hand surgery      left   Family History  Problem Relation Age of Onset  . Diabetes Mother   . Colon cancer Neg Hx   . Diabetes Father    History  Substance Use Topics  . Smoking status: Former Games developermoker  . Smokeless tobacco: Never Used  . Alcohol Use: No   OB History    Gravida Para Term Preterm AB TAB SAB Ectopic Multiple Living   6 4 4  2  2   4      Review of Systems  Constitutional: Positive for activity change. Negative for fever.  HENT: Positive for sore throat. Negative for congestion.   Respiratory: Positive for cough and shortness of breath.   Cardiovascular: Negative for chest pain.  Genitourinary: Negative.   Skin: Negative.     Allergies  Acetaminophen and Benzonatate  Home Medications   Prior to Admission medications   Medication Sig Start Date End Date Taking? Authorizing Provider  albuterol (PROVENTIL HFA;VENTOLIN HFA) 108 (90 BASE) MCG/ACT inhaler Inhale 2 puffs into the lungs every 4 (four) hours as needed for wheezing or shortness of breath. 09/17/14    Hayden Rasmussenavid Cenia Zaragosa, NP  cetirizine (ZYRTEC) 10 MG tablet Take 10 mg by mouth daily.    Historical Provider, MD  dextromethorphan-guaiFENesin (MUCINEX DM) 30-600 MG per 12 hr tablet Take 1 tablet by mouth 2 (two) times daily.    Historical Provider, MD  ibuprofen (ADVIL,MOTRIN) 200 MG tablet Take 400 mg by mouth every 6 (six) hours as needed.    Historical Provider, MD  montelukast (SINGULAIR) 10 MG tablet Take 10 mg by mouth at bedtime.    Historical Provider, MD  predniSONE (DELTASONE) 20 MG tablet Take 3 tabs po on first day, 2 tabs second day, 2 tabs third day, 1 tab fourth day, 1 tab 5th day. Take with food. 09/17/14   Hayden Rasmussenavid Meghan Tiemann, NP  Vitamin D, Ergocalciferol, (DRISDOL) 50000 UNITS CAPS capsule Take 1 capsule (50,000 Units total) by mouth every 7 (seven) days. 08/17/14   Ok EdwardsJuan H Fernandez, MD   BP 126/62 mmHg  Pulse 90  Temp(Src) 98.1 F (36.7 C) (Oral)  Resp 18  SpO2 98% Physical Exam  Constitutional: She is oriented to person, place, and time. She appears well-developed and well-nourished. No distress.  HENT:  Right Ear: External ear normal.  Left Ear: External ear normal.  Mouth/Throat: Oropharynx is clear and moist. No oropharyngeal exudate.  Eyes: Conjunctivae and EOM are normal.  Neck: Normal range of motion.  Cardiovascular: Normal rate and regular  rhythm.   Pulmonary/Chest: Effort normal. No respiratory distress. She has wheezes. She has no rales.  Bilat wheezes, coarseness, esp with cough.  Musculoskeletal: Normal range of motion. She exhibits no edema.  Lymphadenopathy:    She has no cervical adenopathy.  Neurological: She is alert and oriented to person, place, and time.  Skin: Skin is warm and dry.  Psychiatric: She has a normal mood and affect.  Nursing note and vitals reviewed.   ED Course  Procedures (including critical care time) Labs Review Labs Reviewed - No data to display  Imaging Review No results found.   MDM   1. Cough due to bronchospasm   2.  Bronchitis, acute, with bronchospasm    Status post DuoNeb and Solu Medrol 80 mg IM patient states she is breathing better. Lung exam reveals an approximately 60-70% improvement in air movement and decrease in wheezing. Discharged with albuterol HFA as directed Prednisone taper start tomorrow Continue with by mouth Zyrtec, Singulair and Robitussin-DM. For worsening may return otherwise follow-up with your PCP.      Hayden Rasmussenavid Khoury Siemon, NP 09/17/14 [phone]

## 2014-09-17 NOTE — Discharge Instructions (Signed)
Bronchospasm A bronchospasm is a spasm or tightening of the airways going into the lungs. During a bronchospasm breathing becomes more difficult because the airways get smaller. When this happens there can be coughing, a whistling sound when breathing (wheezing), and difficulty breathing. Bronchospasm is often associated with asthma, but not all patients who experience a bronchospasm have asthma. CAUSES  A bronchospasm is caused by inflammation or irritation of the airways. The inflammation or irritation may be triggered by:   Allergies (such as to animals, pollen, food, or mold). Allergens that cause bronchospasm may cause wheezing immediately after exposure or many hours later.   Infection. Viral infections are believed to be the most common cause of bronchospasm.   Exercise.   Irritants (such as pollution, cigarette smoke, strong odors, aerosol sprays, and paint fumes).   Weather changes. Winds increase molds and pollens in the air. Rain refreshes the air by washing irritants out. Cold air may cause inflammation.   Stress and emotional upset.  SIGNS AND SYMPTOMS   Wheezing.   Excessive nighttime coughing.   Frequent or severe coughing with a simple cold.   Chest tightness.   Shortness of breath.  DIAGNOSIS  Bronchospasm is usually diagnosed through a history and physical exam. Tests, such as chest X-rays, are sometimes done to look for other conditions. TREATMENT   Inhaled medicines can be given to open up your airways and help you breathe. The medicines can be given using either an inhaler or a nebulizer machine.  Corticosteroid medicines may be given for severe bronchospasm, usually when it is associated with asthma. HOME CARE INSTRUCTIONS   Always have a plan prepared for seeking medical care. Know when to call your health care provider and local emergency services (911 in the [place] Know where you can access local emergency care.  Only take medicines as  directed by your health care provider.  If you were prescribed an inhaler or nebulizer machine, ask your health care provider to explain how to use it correctly. Always use a spacer with your inhaler if you were given one.  It is necessary to remain calm during an attack. Try to relax and breathe more slowly.  Control your home environment in the following ways:   Change your heating and air conditioning filter at least once a month.   Limit your use of fireplaces and wood stoves.  Do not smoke and do not allow smoking in your home.   Avoid exposure to perfumes and fragrances.   Get rid of pests (such as roaches and mice) and their droppings.   Throw away plants if you see mold on them.   Keep your house clean and dust free.   Replace carpet with wood, tile, or vinyl flooring. Carpet can trap dander and dust.   Use allergy-proof pillows, mattress covers, and box spring covers.   Wash bed sheets and blankets every week in hot water and dry them in a dryer.   Use blankets that are made of polyester or cotton.   Wash hands frequently. SEEK MEDICAL CARE IF:   You have muscle aches.   You have chest pain.   The sputum changes from clear or white to yellow, green, gray, or bloody.   The sputum you cough up gets thicker.   There are problems that may be related to the medicine you are given, such as a rash, itching, swelling, or trouble breathing.  SEEK IMMEDIATE MEDICAL CARE IF:   You have worsening wheezing and coughing even  after taking your prescribed medicines.   You have increased difficulty breathing.   You develop severe chest pain. MAKE SURE YOU:   Understand these instructions.  Will watch your condition.  Will get help right away if you are not doing well or get worse. Document Released: 09/17/2003 Document Revised: 09/19/2013 Document Reviewed: 03/06/2013 [facility] Patient Information 2015 [place], [place]. This information is not  intended to replace advice given to you by your health care provider. Make sure you discuss any questions you have with your health care provider.  Cough, Adult  A cough is a reflex. It helps you clear your throat and airways. A cough can help heal your body. A cough can last 2 or 3 weeks (acute) or may last more than 8 weeks (chronic). Some common causes of a cough can include an infection, allergy, or a cold. HOME CARE  Only take medicine as told by your doctor.  If given, take your medicines (antibiotics) as told. Finish them even if you start to feel better.  Use a cold steam vaporizer or humidifier in your home. This can help loosen thick spit (secretions).  Sleep so you are almost sitting up (semi-upright). Use pillows to do this. This helps reduce coughing.  Rest as needed.  Stop smoking if you smoke. GET HELP RIGHT AWAY IF:  You have yellowish-white fluid (pus) in your thick spit.  Your cough gets worse.  Your medicine does not reduce coughing, and you are losing sleep.  You cough up blood.  You have trouble breathing.  Your pain gets worse and medicine does not help.  You have a fever. MAKE SURE YOU:   Understand these instructions.  Will watch your condition.  Will get help right away if you are not doing well or get worse. Document Released: 05/28/2011 Document Revised: 01/29/2014 Document Reviewed: 05/28/2011 [facility] Patient Information 2015 [place], [place]. This information is not intended to replace advice given to you by your health care provider. Make sure you discuss any questions you have with your health care provider.  How to Use an Inhaler Using your inhaler correctly is very important. Good technique will make sure that the medicine reaches your lungs.  HOW TO USE AN INHALER:  Take the cap off the inhaler.  If this is the first time using your inhaler, you need to prime it. Shake the inhaler for 5 seconds. Release four puffs into the air, away  from your face. Ask your doctor for help if you have questions.  Shake the inhaler for 5 seconds.  Turn the inhaler so the bottle is above the mouthpiece.  Put your pointer finger on top of the bottle. Your thumb holds the bottom of the inhaler.  Open your mouth.  Either hold the inhaler away from your mouth (the width of 2 fingers) or place your lips tightly around the mouthpiece. Ask your doctor which way to use your inhaler.  Breathe out as much air as possible.  Breathe in and push down on the bottle 1 time to release the medicine. You will feel the medicine go in your mouth and throat.  Continue to take a deep breath in very slowly. Try to fill your lungs.  After you have breathed in completely, hold your breath for 10 seconds. This will help the medicine to settle in your lungs. If you cannot hold your breath for 10 seconds, hold it for as long as you can before you breathe out.  Breathe out slowly, through pursed lips.  Whistling is an example of pursed lips.  If your doctor has told you to take more than 1 puff, wait at least 15-30 seconds between puffs. This will help you get the best results from your medicine. Do not use the inhaler more than your doctor tells you to.  Put the cap back on the inhaler.  Follow the directions from your doctor or from the inhaler package about cleaning the inhaler. If you use more than one inhaler, ask your doctor which inhalers to use and what order to use them in. Ask your doctor to help you figure out when you will need to refill your inhaler.  If you use a steroid inhaler, always rinse your mouth with water after your last puff, gargle and spit out the water. Do not swallow the water. GET HELP IF:  The inhaler medicine only partially helps to stop wheezing or shortness of breath.  You are having trouble using your inhaler.  You have some increase in thick spit (phlegm). GET HELP RIGHT AWAY IF:  The inhaler medicine does not help your  wheezing or shortness of breath or you have tightness in your chest.  You have dizziness, headaches, or fast heart rate.  You have chills, fever, or night sweats.  You have a large increase of thick spit, or your thick spit is bloody. MAKE SURE YOU:   Understand these instructions.  Will watch your condition.  Will get help right away if you are not doing well or get worse. Document Released: 06/23/2008 Document Revised: 07/05/2013 Document Reviewed: 04/13/2013 [facility] AveExitCare Patient Information 2015 [place], [place]. This information is not intended to replace advice given to you by your health care provider. Make sure you discuss any questions you have with your health care provider.  Acute Bronchitis Bronchitis is inflammation of the airways that extend from the windpipe into the lungs (bronchi). The inflammation often causes mucus to develop. This leads to a cough, which is the most common symptom of bronchitis.  In acute bronchitis, the condition usually develops suddenly and goes away over time, usually in a couple weeks. Smoking, allergies, and asthma can make bronchitis worse. Repeated episodes of bronchitis may cause further lung problems.  CAUSES Acute bronchitis is most often caused by the same virus that causes a cold. The virus can spread from person to person (contagious) through coughing, sneezing, and touching contaminated objects. SIGNS AND SYMPTOMS   Cough.   Fever.   Coughing up mucus.   Body aches.   Chest congestion.   Chills.   Shortness of breath.   Sore throat.  DIAGNOSIS  Acute bronchitis is usually diagnosed through a physical exam. Your health care provider will also ask you questions about your medical history. Tests, such as chest X-rays, are sometimes done to rule out other conditions.  TREATMENT  Acute bronchitis usually goes away in a couple weeks. Oftentimes, no medical treatment is necessary. Medicines are sometimes given for relief of  fever or cough. Antibiotic medicines are usually not needed but may be prescribed in certain situations. In some cases, an inhaler may be recommended to help reduce shortness of breath and control the cough. A cool mist vaporizer may also be used to help thin bronchial secretions and make it easier to clear the chest.  HOME CARE INSTRUCTIONS  Get plenty of rest.   Drink enough fluids to keep your urine clear or pale yellow (unless you have a medical condition that requires fluid restriction). Increasing fluids may help thin your  respiratory secretions (sputum) and reduce chest congestion, and it will prevent dehydration.   Take medicines only as directed by your health care provider.  If you were prescribed an antibiotic medicine, finish it all even if you start to feel better.  Avoid smoking and secondhand smoke. Exposure to cigarette smoke or irritating chemicals will make bronchitis worse. If you are a smoker, consider using nicotine gum or skin patches to help control withdrawal symptoms. Quitting smoking will help your lungs heal faster.   Reduce the chances of another bout of acute bronchitis by washing your hands frequently, avoiding people with cold symptoms, and trying not to touch your hands to your mouth, nose, or eyes.   Keep all follow-up visits as directed by your health care provider.  SEEK MEDICAL CARE IF: Your symptoms do not improve after 1 week of treatment.  SEEK IMMEDIATE MEDICAL CARE IF:  You develop an increased fever or chills.   You have chest pain.   You have severe shortness of breath.  You have bloody sputum.   You develop dehydration.  You faint or repeatedly feel like you are going to pass out.  You develop repeated vomiting.  You develop a severe headache. MAKE SURE YOU:   Understand these instructions.  Will watch your condition.  Will get help right away if you are not doing well or get worse. Document Released: 10/22/2004 Document  Revised: 01/29/2014 Document Reviewed: 03/07/2013 [facility] Patient Information 2015 [place], [place]. This information is not intended to replace advice given to you by your health care provider. Make sure you discuss any questions you have with your health care provider.

## 2014-09-17 NOTE — ED Notes (Signed)
Pt states that she has had a productive cough and congestion for a week

## 2015-08-26 ENCOUNTER — Encounter: Payer: Self-pay | Admitting: Women's Health

## 2015-08-28 ENCOUNTER — Ambulatory Visit (INDEPENDENT_AMBULATORY_CARE_PROVIDER_SITE_OTHER): Payer: Managed Care, Other (non HMO) | Admitting: Women's Health

## 2015-08-28 ENCOUNTER — Encounter: Payer: Self-pay | Admitting: Women's Health

## 2015-08-28 ENCOUNTER — Telehealth: Payer: Self-pay | Admitting: *Deleted

## 2015-08-28 VITALS — BP 120/76

## 2015-08-28 DIAGNOSIS — N644 Mastodynia: Secondary | ICD-10-CM

## 2015-08-28 NOTE — Telephone Encounter (Signed)
-----   Message from Harrington ChallengerNancy J Young, NP sent at 08/28/2015  9:08 AM EST ----- Please schedule early am or late afternoon, (just started new job)  Needs diagnostic mammogram rt breast painful burning sensation at 9:00 inner aspect

## 2015-08-28 NOTE — Progress Notes (Signed)
Patient ID: Krystal Barber, female   DOB: Oct 26, 1965, 49 y.o.   MRN: [id number] Presents with complaint of increased right breast pain for the past month, tender to touch, denies nipple discharge or injury, has had some right breast tenderness in the past. Irregular cycles this past year, spotting today. Mammogram with ultrasound 06/2014 normal. No family history of breast cancer.  Exam: Appears well. Breast exam and sitting and lying position without palpable nodules, dimpling, nipple discharge, discomfort on exam on right breast on inner aspect.  Right breast mastodynia Perimenopausal  Plan: Diagnostic right breast mammogram. Vitamin E twice daily encouraged.

## 2015-08-28 NOTE — Patient Instructions (Signed)

## 2015-08-28 NOTE — Telephone Encounter (Signed)
DONNA INFORMED PATIENT WITH THE BELOW

## 2015-08-28 NOTE — Telephone Encounter (Signed)
APPOINTMENT 09/04/15 @ 7:50AM, LEFT MESSAGE FOR PT TO CALL.

## 2015-08-29 ENCOUNTER — Other Ambulatory Visit: Payer: Self-pay | Admitting: Women's Health

## 2015-09-04 ENCOUNTER — Ambulatory Visit
Admission: RE | Admit: 2015-09-04 | Discharge: 2015-09-04 | Disposition: A | Discharge status: Home / Self Care | Payer: Managed Care, Other (non HMO) | Source: Ambulatory Visit | Attending: Women's Health | Admitting: Women's Health

## 2015-09-04 ENCOUNTER — Encounter: Payer: Self-pay | Admitting: Women's Health

## 2015-09-04 DIAGNOSIS — N644 Mastodynia: Secondary | ICD-10-CM

## 2015-10-02 ENCOUNTER — Encounter: Payer: Self-pay | Admitting: Internal Medicine

## 2015-11-22 ENCOUNTER — Ambulatory Visit (INDEPENDENT_AMBULATORY_CARE_PROVIDER_SITE_OTHER): Payer: Managed Care, Other (non HMO) | Admitting: Family Medicine

## 2015-11-22 VITALS — BP 110/70 | HR 79 | Temp 98.3°F | Resp 16 | Ht 64.0 in | Wt 224.6 lb

## 2015-11-22 DIAGNOSIS — R059 Cough, unspecified: Secondary | ICD-10-CM

## 2015-11-22 DIAGNOSIS — R05 Cough: Secondary | ICD-10-CM

## 2015-11-22 DIAGNOSIS — R6889 Other general symptoms and signs: Secondary | ICD-10-CM | POA: Diagnosis not present

## 2015-11-22 DIAGNOSIS — Z20828 Contact with and (suspected) exposure to other viral communicable diseases: Secondary | ICD-10-CM

## 2015-11-22 MED ORDER — OSELTAMIVIR PHOSPHATE 75 MG PO CAPS: 75.0000 mg | ORAL_CAPSULE | Freq: Two times a day (BID) | ORAL | Status: DC

## 2015-11-22 NOTE — Patient Instructions (Signed)

## 2015-11-22 NOTE — Progress Notes (Signed)
Chief Complaint:  Chief Complaint  Patient presents with  . fever    on 11/21/2015  . head and throat congestion    3 days  . Cough    3 days    HPI: Krystal Barber is a 50 y.o. female who reports to [facility] today complaining of 2 day history of flu like sxs, fever T mac 103, stuffy head, ears feel clog, throat hurts, she feels tired and her whole body feels it.  She lives with a 12 and 13 year , she does not have sore throat unless coughing. Taking ibuprofen  And mucinex  Past Medical History  Diagnosis Date  . 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 Gestational diabetes   . GERD (gastroesophageal reflux disease)   . Anemia   . Anxiety and depression    Past Surgical History  Procedure Laterality Date  . Tubal ligation  2004  . Cholecystectomy  11/1994  . Pelvic laparoscopy  2000    RIGHT OVARIAN CYSTECTOMY  . Gynecologic cryosurgery  1992  . Hand surgery      left   Social History   Social History  . Marital Status: Divorced    Spouse Name: N/A  . Number of Children: 3  . Years of Education: N/A   Occupational History  . Unemployed    Social History Main Topics  . Smoking status: Former Games developer  . Smokeless tobacco: Never Used  . Alcohol Use: No  . Drug Use: No  . Sexual Activity: Yes    Birth Control/ Protection: Surgical     Comment: TUBAL LIGATION   Other Topics Concern  . None   Social History Narrative   Family History  Problem Relation Age of Onset  . Diabetes Mother   . Colon cancer Neg Hx   . Diabetes Father    Allergies  Allergen Reactions  . Acetaminophen     SEIZURE LIKE SX'S  . Benzonatate [Tessalon Perles]     Chills, shaking, SOB, chest tightness   Prior to Admission medications   Medication Sig Start Date End Date Taking? Authorizing Provider  dextromethorphan-guaiFENesin (MUCINEX DM) 30-600 MG per 12 hr tablet Take 1 tablet by mouth 2 (two) times daily.   Yes Historical Provider, MD  ibuprofen  (ADVIL,MOTRIN) 200 MG tablet Take 400 mg by mouth every 6 (six) hours as needed.   Yes Historical Provider, MD  albuterol (PROVENTIL HFA;VENTOLIN HFA) 108 (90 BASE) MCG/ACT inhaler Inhale 2 puffs into the lungs every 4 (four) hours as needed for wheezing or shortness of breath. Patient not taking: Reported on 11/22/2015 09/17/14   Krystal Rasmussen, NP  cetirizine (ZYRTEC) 10 MG tablet Take 10 mg by mouth daily. Reported on 11/22/2015    Historical Provider, MD  montelukast (SINGULAIR) 10 MG tablet Take 10 mg by mouth at bedtime. Reported on 11/22/2015    Historical Provider, MD  Vitamin D, Ergocalciferol, (DRISDOL) 50000 UNITS CAPS capsule Take 1 capsule (50,000 Units total) by mouth every 7 (seven) days. Patient not taking: Reported on 11/22/2015 08/17/14   Krystal Edwards, MD     ROS: The patient denies  night sweats, unintentional weight loss, chest pain, palpitations, wheezing, dyspnea on exertion, nausea, vomiting, abdominal pain, dysuria, hematuria, melena, numbness,  or tingling.   All other systems have been reviewed and were otherwise negative with the exception of those mentioned in the HPI and as above.    PHYSICAL EXAM:  Filed Vitals:   11/22/15 1528  BP: 110/70  Pulse: 79  Temp: 98.3 F (36.8 C)  Resp: 16   Body mass index is 38.53 kg/(m^2).   General: Alert, no acute distress HEENT:  Normocephalic, atraumatic, oropharynx patent. EOMI, PERRLA Erythematous throat, no exudates, TM normal, + sinus tenderness, + erythematous/boggy nasal mucosa Cardiovascular:  Regular rate and rhythm, no rubs murmurs or gallops.  No Carotid bruits, radial pulse intact. No pedal edema.  Respiratory: Clear to auscultation bilaterally.  No wheezes, rales, or rhonchi.  No cyanosis, no use of accessory musculature Abdominal: No organomegaly, abdomen is soft and non-tender, positive bowel sounds. No masses. Skin: No rashes. Neurologic: Facial musculature symmetric. Psychiatric: Patient acts appropriately  throughout our interaction. Lymphatic: No cervical or submandibular lymphadenopathy Musculoskeletal: Gait intact. No edema, tenderness   LABS: Results for orders placed or performed in visit on 08/16/14  Vit D  25 hydroxy (rtn osteoporosis monitoring)  Result Value Ref Range   Vit D, 25-Hydroxy 17 (L) 30 - 100 ng/mL     EKG/XRAY:   Primary read interpreted by Dr. Conley Rolls at [facility].   ASSESSMENT/PLAN: Encounter Diagnoses  Name Primary?  . Flu-like symptoms Yes  . Cough   . Exposure to the flu    Rx tamiflu BID Work note otc meds for cuhg, nasacort prn  Fu prn   Gross sideeffects, risk and benefits, and alternatives of medications d/w patient. Patient is aware that all medications have potential sideeffects and we are unable to predict every sideeffect or drug-drug interaction that may occur.  Olyver Hawes DO  11/22/2015 4:06 PM

## 2016-02-27 ENCOUNTER — Ambulatory Visit (INDEPENDENT_AMBULATORY_CARE_PROVIDER_SITE_OTHER): Payer: Managed Care, Other (non HMO) | Admitting: Urgent Care

## 2016-02-27 VITALS — BP 118/72 | HR 82 | Temp 97.6°F | Resp 18 | Ht 64.0 in | Wt 226.6 lb

## 2016-02-27 DIAGNOSIS — R0981 Nasal congestion: Secondary | ICD-10-CM | POA: Diagnosis not present

## 2016-02-27 DIAGNOSIS — R059 Cough, unspecified: Secondary | ICD-10-CM

## 2016-02-27 DIAGNOSIS — R05 Cough: Secondary | ICD-10-CM

## 2016-02-27 DIAGNOSIS — J069 Acute upper respiratory infection, unspecified: Secondary | ICD-10-CM | POA: Diagnosis not present

## 2016-02-27 MED ORDER — PSEUDOEPHEDRINE HCL ER 120 MG PO TB12: 120.0000 mg | ORAL_TABLET | Freq: Two times a day (BID) | ORAL | Status: DC

## 2016-02-27 MED ORDER — HYDROCODONE-HOMATROPINE 5-1.5 MG/5ML PO SYRP: 5.0000 mL | ORAL_SOLUTION | Freq: Three times a day (TID) | ORAL | Status: DC | PRN

## 2016-02-27 NOTE — Progress Notes (Signed)
    MRN: [id number] DOB: Feb 21, 1966  Subjective:   Krystal Barber is a 50 y.o. female presenting for chief complaint of URI  Reports 2 day history of productive cough, mild sore throat, nasal congestion, sinus headache. Has tried Singulair, Zyrtec, Mucinex, Advil allergy and congestion with minimal relief. Denies fever, chest pain, shob, wheezing, n/v, abdominal pain, ear pain, sinus pain. Denies history of asthma. Denies smoking cigarettes.  Krystal Barber has a current medication list which includes the following prescription(s): cetirizine and montelukast. Also is allergic to acetaminophen and benzonatate.  Krystal Barber  has a past medical history of History of HPV infection (11/16/2007); Gestational diabetes; GERD (gastroesophageal reflux disease); Anemia; and Anxiety and depression. Also  has past surgical history that includes Tubal ligation (2004); Cholecystectomy (11/1994); Pelvic laparoscopy (2000); Gynecologic cryosurgery (1992); and Hand surgery.  Objective:   Vitals: BP 118/72 mmHg  Pulse 82  Temp(Src) 97.6 F (36.4 C) (Oral)  Resp 18  Ht 5\' 4"  (1.626 m)  Wt 226 lb 9.6 oz (102.785 kg)  BMI 38.88 kg/m2  SpO2 98%  LMP 01/27/2016  Physical Exam  Constitutional: She is oriented to person, place, and time. She appears well-developed and well-nourished.  HENT:  TM's intact bilaterally, no effusions or erythema. Nasal turbinates slightly erythematous with clear mucus. No sinus tenderness. Moderate postnasal drip present, without oropharyngeal exudates, erythema or abscesses.  Eyes: Right eye exhibits no discharge. Left eye exhibits no discharge. No scleral icterus.  Neck: Normal range of motion. Neck supple.  Cardiovascular: Normal rate, regular rhythm and intact distal pulses.  Exam reveals no gallop and no friction rub.   No murmur heard. Pulmonary/Chest: No respiratory distress. She has no wheezes. She has no rales.  Neurological: She is alert and oriented to person, place, and time.    Skin: Skin is warm and dry.   Assessment and Plan :   1. Acute upper respiratory infection 2. Cough 3. Nasal congestion - Likely undergoing viral syndrome. Advised supportive care. RTC in 1 week if no improvement or call and leave a message for me to discuss symptom progression. Consider antibiotic course if no improvement/worse.  Wallis BambergMario Cosmo Tetreault, PA-C [facility] [phone] 02/27/2016 2:40 PM

## 2016-02-27 NOTE — Patient Instructions (Signed)
Upper Respiratory Infection, Adult Most upper respiratory infections (URIs) are a viral infection of the air passages leading to the lungs. A URI affects the nose, throat, and upper air passages. The most common type of URI is nasopharyngitis and is typically referred to as "the common cold." URIs run their course and usually go away on their own. Most of the time, a URI does not require medical attention, but sometimes a bacterial infection in the upper airways can follow a viral infection. This is called a secondary infection. Sinus and middle ear infections are common types of secondary upper respiratory infections. Bacterial pneumonia can also complicate a URI. A URI can worsen asthma and chronic obstructive pulmonary disease (COPD). Sometimes, these complications can require emergency medical care and may be life threatening.  CAUSES Almost all URIs are caused by viruses. A virus is a type of germ and can spread from one person to another.  RISKS FACTORS You may be at risk for a URI if:   You smoke.   You have chronic heart or lung disease.  You have a weakened defense (immune) system.   You are very young or very old.   You have nasal allergies or asthma.  You work in crowded or poorly ventilated areas.  You work in health care facilities or schools. SIGNS AND SYMPTOMS  Symptoms typically develop 2-3 days after you come in contact with a cold virus. Most viral URIs last 7-10 days. However, viral URIs from the influenza virus (flu virus) can last 14-18 days and are typically more severe. Symptoms may include:   Runny or stuffy (congested) nose.   Sneezing.   Cough.   Sore throat.   Headache.   Fatigue.   Fever.   Loss of appetite.   Pain in your forehead, behind your eyes, and over your cheekbones (sinus pain).  Muscle aches.  DIAGNOSIS  Your health care provider may diagnose a URI by:  Physical exam.  Tests to check that your symptoms are not due to  another condition such as:  Strep throat.  Sinusitis.  Pneumonia.  Asthma. TREATMENT  A URI goes away on its own with time. It cannot be cured with medicines, but medicines may be prescribed or recommended to relieve symptoms. Medicines may help:  Reduce your fever.  Reduce your cough.  Relieve nasal congestion. HOME CARE INSTRUCTIONS   Take medicines only as directed by your health care provider.   Gargle warm saltwater or take cough drops to comfort your throat as directed by your health care provider.  Use a warm mist humidifier or inhale steam from a shower to increase air moisture. This may make it easier to breathe.  Drink enough fluid to keep your urine clear or pale yellow.   Eat soups and other clear broths and maintain good nutrition.   Rest as needed.   Return to work when your temperature has returned to normal or as your health care provider advises. You may need to stay home longer to avoid infecting others. You can also use a face mask and careful hand washing to prevent spread of the virus.  Increase the usage of your inhaler if you have asthma.   Do not use any tobacco products, including cigarettes, chewing tobacco, or electronic cigarettes. If you need help quitting, ask your health care provider. PREVENTION  The best way to protect yourself from getting a cold is to practice good hygiene.   Avoid oral or hand contact with people with cold   symptoms.   Wash your hands often if contact occurs.  There is no clear evidence that vitamin C, vitamin E, echinacea, or exercise reduces the chance of developing a cold. However, it is always recommended to get plenty of rest, exercise, and practice good nutrition.  SEEK MEDICAL CARE IF:   You are getting worse rather than better.   Your symptoms are not controlled by medicine.   You have chills.  You have worsening shortness of breath.  You have brown or red mucus.  You have yellow or brown nasal  discharge.  You have pain in your face, especially when you bend forward.  You have a fever.  You have swollen neck glands.  You have pain while swallowing.  You have white areas in the back of your throat. SEEK IMMEDIATE MEDICAL CARE IF:   You have severe or persistent:  Headache.  Ear pain.  Sinus pain.  Chest pain.  You have chronic lung disease and any of the following:  Wheezing.  Prolonged cough.  Coughing up blood.  A change in your usual mucus.  You have a stiff neck.  You have changes in your:  Vision.  Hearing.  Thinking.  Mood. MAKE SURE YOU:   Understand these instructions.  Will watch your condition.  Will get help right away if you are not doing well or get worse.   This information is not intended to replace advice given to you by your health care provider. Make sure you discuss any questions you have with your health care provider.   Document Released: 03/10/2001 Document Revised: 01/29/2015 Document Reviewed: 12/20/2013 Elsevier Interactive Patient Education 2016 Else[employer] Inc.  

## 2016-03-06 ENCOUNTER — Encounter: Payer: Managed Care, Other (non HMO) | Admitting: Gynecology

## 2016-03-19 ENCOUNTER — Encounter: Payer: Self-pay | Admitting: Gynecology

## 2016-03-19 ENCOUNTER — Ambulatory Visit (INDEPENDENT_AMBULATORY_CARE_PROVIDER_SITE_OTHER): Payer: Managed Care, Other (non HMO) | Admitting: Gynecology

## 2016-03-19 ENCOUNTER — Ambulatory Visit (HOSPITAL_COMMUNITY)
Admission: RE | Admit: 2016-03-19 | Discharge: 2016-03-19 | Disposition: A | Discharge status: Home / Self Care | Payer: Managed Care, Other (non HMO) | Source: Ambulatory Visit | Attending: Gynecology | Admitting: Gynecology

## 2016-03-19 VITALS — BP 122/80 | Ht 64.0 in | Wt 223.0 lb

## 2016-03-19 DIAGNOSIS — Z8639 Personal history of other endocrine, nutritional and metabolic disease: Secondary | ICD-10-CM

## 2016-03-19 DIAGNOSIS — R059 Cough, unspecified: Secondary | ICD-10-CM

## 2016-03-19 DIAGNOSIS — N951 Menopausal and female climacteric states: Secondary | ICD-10-CM

## 2016-03-19 DIAGNOSIS — R05 Cough: Secondary | ICD-10-CM | POA: Diagnosis not present

## 2016-03-19 DIAGNOSIS — Z01419 Encounter for gynecological examination (general) (routine) without abnormal findings: Secondary | ICD-10-CM | POA: Diagnosis not present

## 2016-03-19 DIAGNOSIS — R232 Flushing: Secondary | ICD-10-CM

## 2016-03-19 LAB — CBC WITH DIFFERENTIAL/PLATELET
Basophils Absolute: 0 cells/uL (ref 0–200)
Basophils Relative: 0 %
Eosinophils Absolute: 83 cells/uL (ref 15–500)
Eosinophils Relative: 1 %
HCT: 33.2 % — ABNORMAL LOW (ref 35.0–45.0)
Hemoglobin: 10.5 g/dL — ABNORMAL LOW (ref 11.7–15.5)
Lymphocytes Relative: 29 %
Lymphs Abs: 2407 cells/uL (ref 850–3900)
MCH: 25.7 pg — ABNORMAL LOW (ref 27.0–33.0)
MCHC: 31.6 g/dL — ABNORMAL LOW (ref 32.0–36.0)
MCV: 81.2 fL (ref 80.0–100.0)
MPV: 8.8 fL (ref 7.5–12.5)
Monocytes Absolute: 498 cells/uL (ref 200–950)
Monocytes Relative: 6 %
Neutro Abs: 5312 cells/uL (ref 1500–7800)
Neutrophils Relative %: 64 %
Platelets: 288 10*3/uL (ref 140–400)
RBC: 4.09 MIL/uL (ref 3.80–5.10)
RDW: 16.9 % — ABNORMAL HIGH (ref 11.0–15.0)
WBC: 8.3 10*3/uL (ref 3.8–10.8)

## 2016-03-19 LAB — COMPREHENSIVE METABOLIC PANEL
ALT: 28 U/L (ref 6–29)
AST: 24 U/L (ref 10–35)
Albumin: 4.4 g/dL (ref 3.6–5.1)
Alkaline Phosphatase: 112 U/L (ref 33–115)
BUN: 14 mg/dL (ref 7–25)
CO2: 21 mmol/L (ref 20–31)
Calcium: 9.1 mg/dL (ref 8.6–10.2)
Chloride: 104 mmol/L (ref 98–110)
Creat: 0.85 mg/dL (ref 0.50–1.10)
Glucose, Bld: 107 mg/dL — ABNORMAL HIGH (ref 65–99)
Potassium: 4.2 mmol/L (ref 3.5–5.3)
Sodium: 139 mmol/L (ref 135–146)
Total Bilirubin: 0.6 mg/dL (ref 0.2–1.2)
Total Protein: 7.2 g/dL (ref 6.1–8.1)

## 2016-03-19 LAB — LIPID PANEL
Cholesterol: 119 mg/dL — ABNORMAL LOW (ref 125–200)
HDL: 24 mg/dL — ABNORMAL LOW (ref >=46)
LDL Cholesterol: 51 mg/dL (ref <130)
Total CHOL/HDL Ratio: 5 Ratio (ref <=5.0)
Triglycerides: 220 mg/dL — ABNORMAL HIGH (ref <150)
VLDL: 44 mg/dL — ABNORMAL HIGH (ref <30)

## 2016-03-19 LAB — TSH: TSH: 1.65 mIU/L

## 2016-03-19 NOTE — Addendum Note (Signed)
Addended by: Kem ParkinsonBARNES, Daylin Gruszka on: 03/19/2016 09:36 AM   Modules accepted: Orders, SmartSet

## 2016-03-19 NOTE — Patient Instructions (Signed)

## 2016-03-19 NOTE — Progress Notes (Signed)
Krystal MantisSherri C Dimaano 12-22-1965 [id number]   History:    50 y.o.  for annual gyn exam who stated that for the past month she's had a nonproductive cough. She was seen at the family practice clinic back in February 2017 and was diagnosed as flulike symptoms but patient continues with symptoms. She denies any overseas travel. 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 She has lost no weight or any change in appetite. She denies any fever, chills, nausea, or vomiting.  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 She's had a previous tubal sterilization procedure her cycles are reported to be regular. Patient with a longer taking Zoloft or Klonopin. She's been doing well otherwise. She is complaining of hot flashes during the day. Some irritability. She also is noted that her cycles are getting further and further apart sometimes she'll go up to 6 months without a menstrual cycle. She is not sexually active.  Past medical history,surgical history, family history and social history were all reviewed and documented in the EPIC chart.  Gynecologic History Patient's last menstrual period was 01/27/2016. Contraception: tubal ligation Last Pap: 2013. Results were: normal Last mammogram: 2016. Results were: normal  Obstetric History OB History  Gravida Para Term Preterm AB SAB TAB Ectopic Multiple Living  6 4 4  2 2    4     # Outcome Date GA Lbr Len/2nd Weight Sex Delivery Anes PTL Lv  6 SAB           5 SAB           4 Term     M Vag-Spont  N Y  3 Term     M Vag-Spont  N Y  2 Term     M Vag-Spont  N Y  1 Term     M Vag-Spont  N Y       ROS: A ROS was performed and pertinent positives and negatives are included in the history.  GENERAL: No fevers or chills. HEENT: No change in vision, no earache, sore throat or sinus congestion. NECK: No pain or stiffness.  CARDIOVASCULAR: No chest pain or pressure. No palpitations. PULMONARY: Nonproductive cough for one month and laryngitis GASTROINTESTINAL: No abdominal pain, nausea, vomiting or diarrhea, melena or bright red blood per rectum. GENITOURINARY: No urinary frequency, urgency, hesitancy or dysuria. MUSCULOSKELETAL: No joint or muscle pain, no back pain, no recent trauma. DERMATOLOGIC: No rash, no itching, no lesions. ENDOCRINE: No polyuria, polydipsia, no heat or cold intolerance. No recent change in weight. HEMATOLOGICAL: No anemia or easy bruising or bleeding. NEUROLOGIC: No headache, seizures, numbness, tingling or weakness. PSYCHIATRIC: No depression, no loss of interest in normal activity or change in sleep pattern.     Exam: chaperone present  BP 122/80 mmHg  Ht 5\' 4"  (1.626 m)  Wt 223 lb (101.152 kg)  BMI 38.26 kg/m2  LMP 01/27/2016  Body mass index is 38.26 kg/(m^2).  General appearance : Well developed well nourished female. No acute distress HEENT: Eyes: no retinal hemorrhage or exudates,  Neck supple, trachea midline, no carotid bruits, no thyroidmegaly Lungs: Clear to auscultation, no rhonchi or wheezes, or rib retractions  Heart: Regular rate and rhythm, no murmurs or gallops Breast:Examined in sitting and supine position were symmetrical in appearance, no palpable masses or tenderness,  no skin retraction, no nipple inversion, no  nipple discharge, no skin discoloration, no axillary or supraclavicular lymphadenopathy Abdomen: no palpable masses or tenderness, no rebound or guarding Extremities: no edema or skin discoloration or tenderness  Pelvic:  Bartholin, Urethra, Skene Glands: Within normal limits             Vagina: No gross lesions or discharge  Cervix: No gross lesions or discharge  Uterus  anteverted, normal size, shape and consistency, non-tender and mobile  Adnexa  Without masses or tenderness  Anus and perineum  normal   Rectovaginal  normal sphincter tone without  palpated masses or tenderness             Hemoccult not indicated     Assessment/Plan:  50 y.o. female for annual exam with one month history of nonproductive off and laryngitis. We will order a chest x-ray PA and lateral today and refer her to our pulmonary colleague's for further evaluation. Her screening blood work today will consist of the following: CBC, comprehensive metabolic panel, fasting lipid profile, TSH, and urinalysis. Pap smear with HPV screening was done today. Patient was reminded to do her monthly breast exam. Patient due for mammogram December this year. Literature information on the perimenopause and hormone replacement therapy was provided.   Ok EdwardsFERNANDEZ,Nancyann Cotterman H MD, 9:25 AM 03/19/2016

## 2016-03-20 ENCOUNTER — Telehealth: Payer: Self-pay | Admitting: *Deleted

## 2016-03-20 ENCOUNTER — Other Ambulatory Visit: Payer: Self-pay | Admitting: Gynecology

## 2016-03-20 ENCOUNTER — Encounter: Payer: Self-pay | Admitting: Gynecology

## 2016-03-20 DIAGNOSIS — D508 Other iron deficiency anemias: Secondary | ICD-10-CM

## 2016-03-20 DIAGNOSIS — E559 Vitamin D deficiency, unspecified: Secondary | ICD-10-CM

## 2016-03-20 DIAGNOSIS — R058 Other specified cough: Secondary | ICD-10-CM

## 2016-03-20 DIAGNOSIS — R05 Cough: Secondary | ICD-10-CM

## 2016-03-20 DIAGNOSIS — E781 Pure hyperglyceridemia: Secondary | ICD-10-CM

## 2016-03-20 LAB — URINALYSIS W MICROSCOPIC + REFLEX CULTURE
Bacteria, UA: NONE SEEN [HPF]
Bilirubin Urine: NEGATIVE
Casts: NONE SEEN [LPF]
Crystals: NONE SEEN [HPF]
Glucose, UA: NEGATIVE
Hgb urine dipstick: NEGATIVE
Ketones, ur: NEGATIVE
Leukocytes, UA: NEGATIVE
Nitrite: NEGATIVE
Protein, ur: NEGATIVE
RBC / HPF: NONE SEEN RBC/HPF (ref <=2)
Specific Gravity, Urine: 1.016 (ref 1.001–1.035)
WBC, UA: NONE SEEN WBC/HPF (ref <=5)
Yeast: NONE SEEN [HPF]
pH: 5.5 (ref 5.0–8.0)

## 2016-03-20 LAB — VITAMIN D 25 HYDROXY (VIT D DEFICIENCY, FRACTURES): Vit D, 25-Hydroxy: 22 ng/mL — ABNORMAL LOW (ref 30–100)

## 2016-03-20 MED ORDER — VITAMIN D (ERGOCALCIFEROL) 1.25 MG (50000 UNIT) PO CAPS: 50000.0000 [IU] | ORAL_CAPSULE | ORAL | Status: AC

## 2016-03-20 NOTE — Telephone Encounter (Signed)
-----   Message from Keenan BachelorKatherine R Annas, [place]MA sent at 03/19/2016 10:47 AM EDT ----- Regarding: referall to pulmonology Per Dr. Glenetta HewJF "Krystal Barber inform patient chest xray was normal. Make sure we get [facility]."  Patient informed. She said to tell you she will not be available the first two weeks in July.  Thanks!

## 2016-03-20 NOTE — Progress Notes (Signed)
This was resent to Dr. Glenetta HewJF and he buzzed out on the phone inquiring why. I explained that the Vit D 50000 units Rx had a contraindication/warning on it. He reviewed it and gave me verbal okay to prescribe it. Rx sent.

## 2016-03-20 NOTE — Progress Notes (Signed)
Assistant would not allow me to do it. Call in prescription for 50,000 units every weekly for 12 weeks. After the 12 weeks she is to come to the office to check her vitamin D level and then begin taking vitamin D3 cholecalciferol 2000 units daily thereafter.

## 2016-03-20 NOTE — Telephone Encounter (Signed)
Referral placed in epic they will contact pt to schedule. 

## 2016-03-20 NOTE — Progress Notes (Signed)
Call in prescription for vitamin D 50,000 units every weekly for 12 weeks. Patient didn't to return to the office for vitamin D level check after the 12 weeks. Then she is to begin taking vitamin D3 2000 units daily thereafter

## 2016-03-24 LAB — PAP, TP IMAGING W/ HPV RNA, RFLX HPV TYPE 16,18/45: HPV mRNA, High Risk: NOT DETECTED

## 2016-03-26 NOTE — Telephone Encounter (Signed)
Appointment on 04/29/16 @ 11:30am with Dr.Nestor, pt aware.

## 2016-04-29 ENCOUNTER — Institutional Professional Consult (permissible substitution): Payer: Managed Care, Other (non HMO) | Admitting: Pulmonary Disease

## 2016-06-04 ENCOUNTER — Telehealth: Payer: Self-pay | Admitting: Pulmonary Disease

## 2016-06-04 ENCOUNTER — Ambulatory Visit (INDEPENDENT_AMBULATORY_CARE_PROVIDER_SITE_OTHER): Payer: Managed Care, Other (non HMO) | Admitting: Pulmonary Disease

## 2016-06-04 ENCOUNTER — Encounter: Payer: Self-pay | Admitting: Pulmonary Disease

## 2016-06-04 VITALS — BP 122/78 | HR 80 | Ht 63.5 in | Wt 225.8 lb

## 2016-06-04 DIAGNOSIS — R059 Cough, unspecified: Secondary | ICD-10-CM

## 2016-06-04 DIAGNOSIS — R05 Cough: Secondary | ICD-10-CM

## 2016-06-04 DIAGNOSIS — J42 Unspecified chronic bronchitis: Secondary | ICD-10-CM | POA: Diagnosis not present

## 2016-06-04 NOTE — Progress Notes (Signed)
Subjective:    Patient ID: Krystal Barber, female    DOB: 07-Dec-1965, 50 y.o.   MRN: [id number]  HPI She reports in May she had "the flu" and had 6 weeks of cough at the time she was seen by her PCP. She reports her cough has continued to improve. She reports she continues to have a "dry" cough and a "whistling" to her voice. She denies any exacerbating factors. She denies any associated wheezing. Her voice was previously hoarse and this too has improved. Reports dyspnea on exertion. No chest tightness, pain, or pressure. She denies any recent sinus congestion, pressure, or drainage. No reflux or dyspepsia. No morning brash water taste. No fever, chills, or sweats. Previously seen in 2013 by Dr. Delford Field for a similar presentation. She reports she had no breathing problems as a child. In her 23s she began getting "bronchitis" twice yearly in the Fall & Spring. This is routinely treated with steroids and inhalers.   Review of Systems No rashes or abnormal bruising. No adenopathy in her neck, groin, or axilla. A pertinent 14 point review of systems is negative except as per the history of presenting illness.  Allergies  Allergen Reactions  . Acetaminophen     SEIZURE LIKE SX'S  . Benzonatate [Tessalon Perles]     Chills, shaking, SOB, chest tightness    Current Outpatient Prescriptions on File Prior to Visit  Medication Sig Dispense Refill  . Vitamin D, Ergocalciferol, (DRISDOL) 50000 units CAPS capsule Take 1 capsule (50,000 Units total) by mouth every 7 (seven) days. 12 capsule 0   No current facility-administered medications on file prior to visit.     Past Medical History:  Diagnosis Date  . Anemia   . Anxiety and depression   . GERD (gastroesophageal reflux disease)   . Gestational diabetes   . 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 Vitamin D deficiency     Past Surgical History:  Procedure Laterality Date  . CHOLECYSTECTOMY  11/1994  . GYNECOLOGIC  CRYOSURGERY  1992  . HAND SURGERY     left  . PELVIC LAPAROSCOPY  2000   RIGHT OVARIAN CYSTECTOMY  . TUBAL LIGATION  2004    Family History  Problem Relation Age of Onset  . Diabetes Mother   . Diabetes Father   . Asthma Son   . Colon cancer Neg Hx   . Lung disease Neg Hx     Social History   Social History  . Marital status: Divorced    Spouse name: N/A  . Number of children: 3  . Years of education: N/A   Occupational History  . payroll specialist    Social History Main Topics  . Smoking status: Passive Smoke Exposure - Never Smoker  . Smokeless tobacco: Never Used     Comment: Step-father  . Alcohol use No  . Drug use: No  . Sexual activity: Not Currently    Birth control/ protection: Surgical     Comment: TUBAL LIGATION   Other Topics Concern  . None   Social History Narrative   Originally from [place]. Always lived in [place]. Has primarily done office work. Currently has 2 dogs. No bird, mold, or hot tub exposure.      Objective:   Physical Exam BP 122/78 (BP Location: Left Arm, Cuff Size: Large)   Pulse 80   Ht 5' 3.5" (1.613 m)   Wt 225 lb 12.8 oz (102.4 kg)   SpO2  95%   BMI 39.37 kg/m  General:  Awake. Alert. No acute distress. Mild central obesity.  Integument:  Warm & dry. No rash on exposed skin. No bruising. Lymphatics:  No appreciated cervical or supraclavicular lymphadenoapthy. HEENT:  Moist mucus membranes. No oral ulcers. No scleral injection or icterus.  Cardiovascular:  Regular rate. No edema. Normal S1 & S2.  Pulmonary:  Good aeration & clear to auscultation bilaterally. Symmetric chest wall expansion. No accessory muscle use. Abdomen: Soft. Normal bowel sounds. Protuberant. Musculoskeletal:  Normal bulk and tone. Hand grip strength 5/5 bilaterally. No joint deformity or effusion appreciated. Neurological:  CN 2-12 grossly in tact. No meningismus. Moving all 4 extremities equally. Symmetric brachioradialis deep tendon reflexes. Psychiatric:   Mood and affect congruent. Speech normal rhythm, rate & tone.   PFT 12/18/11: FVC 3.58 L (103%) FEV1 3.17 L (110%) FEV1/FVC 0.89 FEF 25-75.43 L (138%)  IMAGING CXR PA/LAT 03/19/16 (personally reviewed by me): No focal opacity or mass appreciated. No pleural effusion or thickening. Heart normal in size & mediastinum normal in contour.   LABS 03/19/16 CBC: 8.3/10.5/33.2/288 BMP: 139/4.2/104/21/14/0.85/107/9.1 LFT: 4.4/7.2/0.6/112/24/28  03/15/09 Alpha-1 antitrypsin: 146 ANA: Negative    Assessment & Plan:  50 y.o. female with a history of recurrent/chronic bronchitis that seems to have a seasonal pattern. I suspect she currently has a postviral cough that she is recovering from. I did review her previous spirometry from 2013 which was normal as well as her chest x-ray from June of this year which also was normal. It's highly suspicious for underlying asthma/reactive airways disease as a cause for her recurrent episodes of bronchitis and severity of illness with respiratory infections. I instructed the patient to notify my office if she had any new breathing problems before her next appointment as I would be happy to see her sooner.  1. Cough: Suspect postviral cough. Continuing to improve. 2. Chronic bronchitis: Suspect underlying asthma. Checking full pulmonary function testing at follow-up appointment. Advised patient we may need to have her undergo methacholine challenge testing. May require evaluation with RAST panel and quantitative immunoglobulin panel as well. 3. Follow-up: Patient to return to clinic in 3 months or sooner if needed.  Donna ChristenJennings E. Jamison NeighborNestor, M.D. [facility] Pager:  [phone] After 3pm or if no response, call [phone] 4:07 PM 06/04/16

## 2016-06-04 NOTE — Patient Instructions (Signed)
   Call me if you have any new breathing problems or feel like your cough isn't continuing to improve.  We will do a breathing test at your next appointment with me in 3 months.  TESTS ORDERED: 1. Full PFTs at Follow-up

## 2016-06-04 NOTE — Telephone Encounter (Signed)
PFT 12/18/11: FVC 3.58 L (103%) FEV1 3.17 L (110%) FEV1/FVC 0.9 FEF 25-75.43 L (138%)  IMAGING CXR PA/LAT 03/19/16 (personally reviewed by me): No focal opacity or mass appreciated. No pleural effusion or thickening. Heart normal in size & mediastinum normal in contour.   LABS 03/19/16 CBC: 8.3/10.5/33.2/288 BMP: 139/4.2/104/21/14/0.85/107/9.1 LFT: 4.4/7.2/0.6/112/24/28  03/15/09 Alpha-1 antitrypsin: 146 ANA: Negative

## 2016-06-09 ENCOUNTER — Other Ambulatory Visit: Payer: Self-pay | Admitting: Gynecology

## 2016-06-12 ENCOUNTER — Ambulatory Visit (HOSPITAL_COMMUNITY)
Admission: EM | Admit: 2016-06-12 | Discharge: 2016-06-12 | Disposition: A | Discharge status: Home / Self Care | Payer: Managed Care, Other (non HMO) | Attending: Family Medicine | Admitting: Family Medicine

## 2016-06-12 ENCOUNTER — Encounter (HOSPITAL_COMMUNITY): Payer: Self-pay | Admitting: Emergency Medicine

## 2016-06-12 DIAGNOSIS — S40021A Contusion of right upper arm, initial encounter: Secondary | ICD-10-CM | POA: Diagnosis not present

## 2016-06-12 DIAGNOSIS — S0990XA Unspecified injury of head, initial encounter: Secondary | ICD-10-CM

## 2016-06-12 NOTE — ED Provider Notes (Signed)
CSN: [id number]     Arrival date & time 06/12/16  [id number] History   First MD Initiated Contact with Patient 06/12/16 1035     Chief Complaint  Patient presents with  . Head Injury   (Consider location/radiation/quality/duration/timing/severity/associated sxs/prior Treatment) 50 year old female presents with right side head injury and right arm/shoulder injury after falling when getting out of bed early this morning. She felt a little dizzy when she tried to get out of bed and she believes she hit her head on her nightstand and then went back to bed and fell asleep. She denies any vision changes, nausea, vomiting or syncope. She is still experiencing slight dizziness and left sided head pain as well. She has not taken anything for pain but has applied some ice to her head for comfort.    The history is provided by the patient.  Head Injury  Location:  R temporal Time since incident:  6 hours Mechanism of injury: fall   Fall:    Fall occurred:  From a bed   Impact surface:  Financial controllerurniture   Point of impact:  Head   Entrapped after fall: no   Pain details:    Quality:  Pressure and dull   Severity:  Moderate   Timing:  Constant   Progression:  Unchanged Chronicity:  New Relieved by:  Nothing Ineffective treatments:  Ice Associated symptoms: headache   Associated symptoms: no blurred vision, no disorientation, no double vision, no focal weakness, no memory loss, no nausea, no neck pain, no numbness, no seizures and no vomiting     Past Medical History:  Diagnosis Date  . Anemia   . Anxiety and depression   . GERD (gastroesophageal reflux disease)   . Gestational diabetes   . 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 Vitamin D deficiency    Past Surgical History:  Procedure Laterality Date  . CHOLECYSTECTOMY  11/1994  . GYNECOLOGIC CRYOSURGERY  1992  . HAND SURGERY     left  . PELVIC LAPAROSCOPY  2000   RIGHT OVARIAN CYSTECTOMY  . TUBAL LIGATION  2004   Family  History  Problem Relation Age of Onset  . Diabetes Mother   . Diabetes Father   . Asthma Son   . Colon cancer Neg Hx   . Lung disease Neg Hx    Social History  Substance Use Topics  . Smoking status: Passive Smoke Exposure - Never Smoker  . Smokeless tobacco: Never Used     Comment: Step-father  . Alcohol use No   OB History    Gravida Para Term Preterm AB Living   6 4 4   2 4    SAB TAB Ectopic Multiple Live Births   2       4     Review of Systems  Constitutional: Negative for chills, fatigue and fever.  HENT: Negative for congestion.   Eyes: Negative for blurred vision, double vision and visual disturbance.  Respiratory: Negative for chest tightness and shortness of breath.   Cardiovascular: Negative for chest pain.  Gastrointestinal: Negative for nausea and vomiting.  Genitourinary: Negative for difficulty urinating and dysuria.  Musculoskeletal: Negative for neck pain.  Skin: Positive for wound.  Neurological: Positive for dizziness and headaches. Negative for focal weakness, seizures, syncope, facial asymmetry, speech difficulty, weakness and numbness.  Hematological: Does not bruise/bleed easily.  Psychiatric/Behavioral: Negative for memory loss.    Allergies  Acetaminophen and Benzonatate [tessalon perles]  Home  Medications   Prior to Admission medications   Medication Sig Start Date End Date Taking? Authorizing Provider  Vitamin D, Ergocalciferol, (DRISDOL) 50000 units CAPS capsule Take 1 capsule (50,000 Units total) by mouth every 7 (seven) days. 03/20/16   [name], MD   Meds Ordered and Administered this Visit  Medications - No data to display  BP 111/72 (BP Location: Right Arm)   Pulse 72   Temp 98.2 F (36.8 C) (Oral)   Resp 12   SpO2 100%  No data found.   Physical Exam  Constitutional: She is oriented to person, place, and time. She appears well-developed and well-nourished. No distress.  HENT:  Head: Normocephalic. Head is with  contusion. Head is without laceration.    Right Ear: Hearing, tympanic membrane, external ear and ear canal normal.  Left Ear: Hearing, tympanic membrane, external ear and ear canal normal.  Nose: Nose normal.  Mouth/Throat: Oropharynx is clear and moist.  About quarter-sized swelling and soreness present on right temporal area just anterior to top of ear. Minimal ecchymosis present. No bleeding. No laceration. Tender to palpation.   Eyes: Conjunctivae and EOM are normal. Pupils are equal, round, and reactive to light.  Neck: Normal range of motion. Neck supple.  Cardiovascular: Normal rate, regular rhythm and normal heart sounds.   Pulmonary/Chest: Effort normal and breath sounds normal.  Musculoskeletal: Normal range of motion. She exhibits tenderness. She exhibits no deformity.       Right upper arm: She exhibits tenderness. She exhibits no swelling, no edema and no laceration.       Arms: Right deltoid near shoulder with slight ecchymosis and tenderness. Has full range of motion of shoulder and arm. No sensory or neuro deficits noted.   Lymphadenopathy:    She has no cervical adenopathy.  Neurological: She is alert and oriented to person, place, and time. She has normal strength. No cranial nerve deficit or sensory deficit. She displays a negative Romberg sign.  Skin: Skin is warm and dry. Capillary refill takes less than 2 seconds.  Psychiatric: She has a normal mood and affect. Her behavior is normal. Judgment and thought content normal.    Urgent Care Course   Clinical Course    Procedures (including critical care time)  Labs Review Labs Reviewed - No data to display  Imaging Review No results found.   Visual Acuity Review  Right Eye Distance:   Left Eye Distance:   Bilateral Distance:    Right Eye Near:   Left Eye Near:    Bilateral Near:         MDM   1. Head injury, initial encounter   2. Arm contusion, right, initial encounter    Discussed that she  has a mild contusion to her head and shoulder. No definite signs of concussion. Discussed that any head trauma can cause pain on opposite side of head due to transferred force. Offered pain medication such as Ultram but patient declined since nervous about allergies to pain medication. She wants to continue with applying ice to area and may take Ibuprofen 600mg  every 8 hours as needed for pain. Recommend follow-up with a primary care provider in 3 days for recheck or go to ER if any increased pain, swelling, dizziness, vision changes or vomiting occurs.     Sudie Grumbling, NP 06/13/16 1049

## 2016-06-12 NOTE — Discharge Instructions (Signed)
Recommend Ibuprofen as directed as needed for pain since you are allergic to Tylenol. Rest. Recommend go to ER if any vision changes, nausea, vomiting, increase pain, or any syncope occurs.

## 2016-06-12 NOTE — ED Triage Notes (Signed)
Pt reports she hit her right side of head on the corner of wooden nightstand  Reports she woke up due to CP that subsided and was moving in her bed when she hit her head... Went back to sleep but still having pain  A&O x4... NAD

## 2016-06-29 ENCOUNTER — Ambulatory Visit (INDEPENDENT_AMBULATORY_CARE_PROVIDER_SITE_OTHER): Payer: Managed Care, Other (non HMO) | Admitting: Family Medicine

## 2016-06-29 VITALS — BP 122/72 | HR 76 | Temp 98.7°F | Resp 17 | Ht 64.5 in | Wt 224.0 lb

## 2016-06-29 DIAGNOSIS — R42 Dizziness and giddiness: Secondary | ICD-10-CM

## 2016-06-29 DIAGNOSIS — Z87820 Personal history of traumatic brain injury: Secondary | ICD-10-CM

## 2016-06-29 DIAGNOSIS — Z862 Personal history of diseases of the blood and blood-forming organs and certain disorders involving the immune mechanism: Secondary | ICD-10-CM | POA: Diagnosis not present

## 2016-06-29 DIAGNOSIS — H8110 Benign paroxysmal vertigo, unspecified ear: Secondary | ICD-10-CM

## 2016-06-29 LAB — POCT CBC
Granulocyte percent: 64.7 %G (ref 37–80)
HCT, POC: 30.7 % — AB (ref 37.7–47.9)
Hemoglobin: 10.9 g/dL — AB (ref 12.2–16.2)
Lymph, poc: 2.5 (ref 0.6–3.4)
MCH, POC: 26.6 pg — AB (ref 27–31.2)
MCHC: 35.6 g/dL — AB (ref 31.8–35.4)
MCV: 74.9 fL — AB (ref 80–97)
MID (cbc): 0.4 (ref 0–0.9)
MPV: 6.5 fL (ref 0–99.8)
POC Granulocyte: 5.4 (ref 2–6.9)
POC LYMPH PERCENT: 29.9 %L (ref 10–50)
POC MID %: 5.4 %M (ref 0–12)
Platelet Count, POC: 267 10*3/uL (ref 142–424)
RBC: 4.09 M/uL (ref 4.04–5.48)
RDW, POC: 17.9 %
WBC: 8.3 10*3/uL (ref 4.6–10.2)

## 2016-06-29 LAB — GLUCOSE, POCT (MANUAL RESULT ENTRY): POC Glucose: 115 mg/dl — AB (ref 70–99)

## 2016-06-29 NOTE — Patient Instructions (Addendum)
Recommend taking iron over-the-counter one twice daily for 1 month, then once daily for 2-3 months to rebuild your blood count.  Meclizine 25 mg 1/2-1 pill 3 times daily if needed for dizziness. Initially I would recommend you take 1 at bedtime to see if that will help improve the morning dizziness. Then take a 1:30 if the dizziness is too bad in the mornings. This seems to be a very mild case of benign positional vertigo.  If the dizziness continues to persist we will make referral to an ear nose and throat doctor. Call back in the next couple of weeks if needed for that referral. Benign Positional Vertigo Vertigo is the feeling that you or your surroundings are moving when they are not. Benign positional vertigo is the most common form of vertigo. The cause of this condition is not serious (is benign). This condition is triggered by certain movements and positions (is positional). This condition can be dangerous if it occurs while you are doing something that could endanger you or others, such as driving.  CAUSES In many cases, the cause of this condition is not known. It may be caused by a disturbance in an area of the inner ear that helps your brain to sense movement and balance. This disturbance can be caused by a viral infection (labyrinthitis), head injury, or repetitive motion. RISK FACTORS This condition is more likely to develop in:  Women.  People who are 48 years of age or older. SYMPTOMS Symptoms of this condition usually happen when you move your head or your eyes in different directions. Symptoms may start suddenly, and they usually last for less than a minute. Symptoms may include:  Loss of balance and falling.  Feeling like you are spinning or moving.  Feeling like your surroundings are spinning or moving.  Nausea and vomiting.  Blurred vision.  Dizziness.  Involuntary eye movement (nystagmus). Symptoms can be mild and cause only slight annoyance, or they can be  severe and interfere with daily life. Episodes of benign positional vertigo may return (recur) over time, and they may be triggered by certain movements. Symptoms may improve over time. DIAGNOSIS This condition is usually diagnosed by medical history and a physical exam of the head, neck, and ears. You may be referred to a health care provider who specializes in ear, nose, and throat (ENT) problems (otolaryngologist) or a provider who specializes in disorders of the nervous system (neurologist). You may have additional testing, including:  MRI.  A CT scan.  Eye movement tests. Your health care provider may ask you to change positions quickly while he or she watches you for symptoms of benign positional vertigo, such as nystagmus. Eye movement may be tested with an electronystagmogram (ENG), caloric stimulation, the Dix-Hallpike test, or the roll test.  An electroencephalogram (EEG). This records electrical activity in your brain.  Hearing tests. TREATMENT Usually, your health care provider will treat this by moving your head in specific positions to adjust your inner ear back to normal. Surgery may be needed in severe cases, but this is rare. In some cases, benign positional vertigo may resolve on its own in 2-4 weeks. HOME CARE INSTRUCTIONS Safety  Move slowly.Avoid sudden body or head movements.  Avoid driving.  Avoid operating heavy machinery.  Avoid doing any tasks that would be dangerous to you or others if a vertigo episode would occur.  If you have trouble walking or keeping your balance, try using a cane for stability. If you feel dizzy or  unstable, sit down right away.  Return to your normal activities as told by your health care provider. Ask your health care provider what activities are safe for you. General Instructions  Take over-the-counter and prescription medicines only as told by your health care provider.  Avoid certain positions or movements as told by your health  care provider.  Drink enough fluid to keep your urine clear or pale yellow.  Keep all follow-up visits as told by your health care provider. This is important. SEEK MEDICAL CARE IF:  You have a fever.  Your condition gets worse or you develop new symptoms.  Your family or friends notice any behavioral changes.  Your nausea or vomiting gets worse.  You have numbness or a "pins and needles" sensation. SEEK IMMEDIATE MEDICAL CARE IF:  You have difficulty speaking or moving.  You are always dizzy.  You faint.  You develop severe headaches.  You have weakness in your legs or arms.  You have changes in your hearing or vision.  You develop a stiff neck.  You develop sensitivity to light.   This information is not intended to replace advice given to you by your health care provider. Make sure you discuss any questions you have with your health care provider.   Document Released: 06/22/2006 Document Revised: 06/05/2015 Document Reviewed: 01/07/2015 Elsevier Interactive Patient Education [employer]    IF you received an x-ray today, you will receive an invoice from [facility]. Please contact [facility] at [phone] with questions or concerns regarding your invoice.   IF you received labwork today, you will receive an invoice from [employer]. Please contact [facility] at [phone] with questions or concerns regarding your invoice.   Our billing staff will not be able to assist you with questions regarding bills from these companies.  You will be contacted with the lab results as soon as they are available. The fastest way to get your results is to activate your My Chart account. Instructions are located on the last page of this paperwork. If you have not heard from [place] regarding the results in 2 weeks, please contact this office.

## 2016-06-29 NOTE — Progress Notes (Signed)
Patient ID: Krystal Barber, female    DOB: Jun 01, 1966  Age: 50 y.o. MRN: [id number]  Chief Complaint  Patient presents with  . Dizziness    Subjective:   50 year old lady who has been having some dizziness over the past couple of weeks. It primarily occurs in the morning when she gets out of bed and stands up. It takes a few seconds to get her balance so she doesn't feel stable. She has always improved been benefited the course of the day. This morning the dizziness lasted longer than it has been doing the other day so she came on him. She is not on any major medications except for weekly vitamin D. She has not taken any over-the-counter medications. She does not smoke, drink, or use any drugs. She does not get regular exercise. She works behind a [employer] all day, rarely gets up, and has not noticed any problems then. She's not been having any other neurologic symptoms in her extremities or trunk. She has not noticed a change in her vision. She does have a history of anemia off and on her life. She has not been having headaches. She did have a closed head injury about 3 weeks ago when she had a charley horse in her leg, popped up in the nighttime to try and work that out and fell with her head hitting between the dresser in bed. She contused her right arm. She went to the emergency room and was evaluated but was not labeled as having had a concussion. She does not think she was knocked out. She is back up into bed and went back to sleep.  Current allergies, medications, problem list, past/family and social histories reviewed.  Objective:  BP 122/72 (BP Location: Right Arm, Patient Position: Sitting, Cuff Size: Normal)   Pulse 76   Temp 98.7 F (37.1 C) (Oral)   Resp 17   Ht 5' 4.5" (1.638 m)   Wt 224 lb (101.6 kg)   SpO2 98%   BMI 37.86 kg/m   No major acute distress. TMs normal. Eyes PERRLA. Fundi benign. Neck supple without nodes. Neurologic screening normal. Cranial nerves intact. Motor  strength symmetrical with rapid alternating movements normal. Tandem walk normal. Romberg negative. Fully alert and oriented.  Assessment & Plan:   Assessment: 1. Dizziness and giddiness   2. Benign paroxysmal positional vertigo, unspecified laterality   3. History of closed head injury   4. History of anemia       Plan: We'll check CBC and glucose and then decide recommendations.  Orders Placed This Encounter  Procedures  . POCT CBC  . POCT glucose (manual entry)    No orders of the defined types were placed in this encounter.   Results for orders placed or performed in visit on 06/29/16  POCT CBC  Result Value Ref Range   WBC 8.3 4.6 - 10.2 K/uL   Lymph, poc 2.5 0.6 - 3.4   POC LYMPH PERCENT 29.9 10 - 50 %L   MID (cbc) 0.4 0 - 0.9   POC MID % 5.4 0 - 12 %M   POC Granulocyte 5.4 2 - 6.9   Granulocyte percent 64.7 37 - 80 %G   RBC 4.09 4.04 - 5.48 M/uL   Hemoglobin 10.9 (A) 12.2 - 16.2 g/dL   HCT, POC 65.7 (A) 84.6 - 47.9 %   MCV 74.9 (A) 80 - 97 fL   MCH, POC 26.6 (A) 27 - 31.2 pg   MCHC 35.6 (A) 31.8 -  35.4 g/dL   RDW, POC 16.1 %   Platelet Count, POC 267 142 - 424 K/uL   MPV 6.5 0 - 99.8 fL  POCT glucose (manual entry)  Result Value Ref Range   POC Glucose 115 (A) 70 - 99 mg/dl        Patient Instructions    Recommend taking iron over-the-counter one twice daily for 1 month, then once daily for 2-3 months to rebuild your blood count.  Meclizine 25 mg 1/2-1 pill 3 times daily if needed for dizziness. Initially I would recommend you take 1 at bedtime to see if that will help improve the morning dizziness. Then take a 1:30 if the dizziness is too bad in the mornings. This seems to be a very mild case of benign positional vertigo.  If the dizziness continues to persist we will make referral to an ear nose and throat doctor. Call back in the next couple of weeks if needed for that referral. Benign Positional Vertigo Vertigo is the feeling that you or your  surroundings are moving when they are not. Benign positional vertigo is the most common form of vertigo. The cause of this condition is not serious (is benign). This condition is triggered by certain movements and positions (is positional). This condition can be dangerous if it occurs while you are doing something that could endanger you or others, such as driving.  CAUSES In many cases, the cause of this condition is not known. It may be caused by a disturbance in an area of the inner ear that helps your brain to sense movement and balance. This disturbance can be caused by a viral infection (labyrinthitis), head injury, or repetitive motion. RISK FACTORS This condition is more likely to develop in:  Women.  People who are 48 years of age or older. SYMPTOMS Symptoms of this condition usually happen when you move your head or your eyes in different directions. Symptoms may start suddenly, and they usually last for less than a minute. Symptoms may include:  Loss of balance and falling.  Feeling like you are spinning or moving.  Feeling like your surroundings are spinning or moving.  Nausea and vomiting.  Blurred vision.  Dizziness.  Involuntary eye movement (nystagmus). Symptoms can be mild and cause only slight annoyance, or they can be severe and interfere with daily life. Episodes of benign positional vertigo may return (recur) over time, and they may be triggered by certain movements. Symptoms may improve over time. DIAGNOSIS This condition is usually diagnosed by medical history and a physical exam of the head, neck, and ears. You may be referred to a health care provider who specializes in ear, nose, and throat (ENT) problems (otolaryngologist) or a provider who specializes in disorders of the nervous system (neurologist). You may have additional testing, including:  MRI.  A CT scan.  Eye movement tests. Your health care provider may ask you to change positions quickly while he  or she watches you for symptoms of benign positional vertigo, such as nystagmus. Eye movement may be tested with an electronystagmogram (ENG), caloric stimulation, the Dix-Hallpike test, or the roll test.  An electroencephalogram (EEG). This records electrical activity in your brain.  Hearing tests. TREATMENT Usually, your health care provider will treat this by moving your head in specific positions to adjust your inner ear back to normal. Surgery may be needed in severe cases, but this is rare. In some cases, benign positional vertigo may resolve on its own in 2-4 weeks. HOME  CARE INSTRUCTIONS Safety  Move slowly.Avoid sudden body or head movements.  Avoid driving.  Avoid operating heavy machinery.  Avoid doing any tasks that would be dangerous to you or others if a vertigo episode would occur.  If you have trouble walking or keeping your balance, try using a cane for stability. If you feel dizzy or unstable, sit down right away.  Return to your normal activities as told by your health care provider. Ask your health care provider what activities are safe for you. General Instructions  Take over-the-counter and prescription medicines only as told by your health care provider.  Avoid certain positions or movements as told by your health care provider.  Drink enough fluid to keep your urine clear or pale yellow.  Keep all follow-up visits as told by your health care provider. This is important. SEEK MEDICAL CARE IF:  You have a fever.  Your condition gets worse or you develop new symptoms.  Your family or friends notice any behavioral changes.  Your nausea or vomiting gets worse.  You have numbness or a "pins and needles" sensation. SEEK IMMEDIATE MEDICAL CARE IF:  You have difficulty speaking or moving.  You are always dizzy.  You faint.  You develop severe headaches.  You have weakness in your legs or arms.  You have changes in your hearing or vision.  You  develop a stiff neck.  You develop sensitivity to light.   This information is not intended to replace advice given to you by your health care provider. Make sure you discuss any questions you have with your health care provider.   Document Released: 06/22/2006 Document Revised: 06/05/2015 Document Reviewed: 01/07/2015 Elsevier Interactive Patient Education [employer]    IF you received an x-ray today, you will receive an invoice [facility]. Please contact [facility] at [phone] with questions or concerns regarding your invoice.   IF you received labwork today, you will receive an invoice from [facility]. Please contact [facility] at [phone] with questions or concerns regarding your invoice.   Our billing staff will not be able to assist you with questions regarding bills from these companies.  You will be contacted with the lab results as soon as they are available. The fastest way to get your results is to activate your My Chart account. Instructions are located on the last page of this paperwork. If you have not heard from us regarding the results in 2 weeks, please contact this office.         No Follow-up on file.   HOPPER,DAVID, MD 06/29/2016

## 2016-09-10 ENCOUNTER — Ambulatory Visit: Payer: Managed Care, Other (non HMO) | Admitting: Pulmonary Disease

## 2017-02-10 ENCOUNTER — Encounter: Payer: Self-pay | Admitting: Gynecology

## 2020-09-26 ENCOUNTER — Emergency Department (HOSPITAL_COMMUNITY): Payer: BLUE CROSS/BLUE SHIELD

## 2020-09-26 ENCOUNTER — Other Ambulatory Visit: Payer: Self-pay

## 2020-09-26 ENCOUNTER — Inpatient Hospital Stay (HOSPITAL_COMMUNITY)
Admission: EM | Admit: 2020-09-26 | Discharge: 2020-09-29 | DRG: 177 | Disposition: A | Discharge status: Home / Self Care | Payer: BLUE CROSS/BLUE SHIELD | Attending: Internal Medicine | Admitting: Internal Medicine

## 2020-09-26 ENCOUNTER — Encounter (HOSPITAL_COMMUNITY): Payer: Self-pay | Admitting: Emergency Medicine

## 2020-09-26 DIAGNOSIS — F419 Anxiety disorder, unspecified: Secondary | ICD-10-CM | POA: Diagnosis present

## 2020-09-26 DIAGNOSIS — K219 Gastro-esophageal reflux disease without esophagitis: Secondary | ICD-10-CM | POA: Diagnosis present

## 2020-09-26 DIAGNOSIS — U071 COVID-19: Secondary | ICD-10-CM | POA: Diagnosis present

## 2020-09-26 DIAGNOSIS — Z79899 Other long term (current) drug therapy: Secondary | ICD-10-CM

## 2020-09-26 DIAGNOSIS — Z7722 Contact with and (suspected) exposure to environmental tobacco smoke (acute) (chronic): Secondary | ICD-10-CM | POA: Diagnosis present

## 2020-09-26 DIAGNOSIS — J1282 Pneumonia due to coronavirus disease 2019: Secondary | ICD-10-CM

## 2020-09-26 DIAGNOSIS — R739 Hyperglycemia, unspecified: Secondary | ICD-10-CM | POA: Diagnosis not present

## 2020-09-26 DIAGNOSIS — F32A Depression, unspecified: Secondary | ICD-10-CM | POA: Diagnosis present

## 2020-09-26 DIAGNOSIS — A0839 Other viral enteritis: Secondary | ICD-10-CM | POA: Diagnosis present

## 2020-09-26 DIAGNOSIS — R0902 Hypoxemia: Secondary | ICD-10-CM

## 2020-09-26 DIAGNOSIS — Z888 Allergy status to other drugs, medicaments and biological substances status: Secondary | ICD-10-CM

## 2020-09-26 DIAGNOSIS — Z886 Allergy status to analgesic agent status: Secondary | ICD-10-CM

## 2020-09-26 DIAGNOSIS — T380X5A Adverse effect of glucocorticoids and synthetic analogues, initial encounter: Secondary | ICD-10-CM | POA: Diagnosis not present

## 2020-09-26 DIAGNOSIS — Z9049 Acquired absence of other specified parts of digestive tract: Secondary | ICD-10-CM

## 2020-09-26 DIAGNOSIS — E669 Obesity, unspecified: Secondary | ICD-10-CM | POA: Diagnosis present

## 2020-09-26 DIAGNOSIS — G4700 Insomnia, unspecified: Secondary | ICD-10-CM | POA: Diagnosis present

## 2020-09-26 DIAGNOSIS — Z6838 Body mass index (BMI) 38.0-38.9, adult: Secondary | ICD-10-CM | POA: Diagnosis not present

## 2020-09-26 DIAGNOSIS — J9601 Acute respiratory failure with hypoxia: Secondary | ICD-10-CM | POA: Diagnosis present

## 2020-09-26 DIAGNOSIS — E559 Vitamin D deficiency, unspecified: Secondary | ICD-10-CM | POA: Diagnosis present

## 2020-09-26 LAB — COMPREHENSIVE METABOLIC PANEL
ALT: 67 U/L — ABNORMAL HIGH (ref 0–44)
AST: 103 U/L — ABNORMAL HIGH (ref 15–41)
Albumin: 4.1 g/dL (ref 3.5–5.0)
Alkaline Phosphatase: 139 U/L — ABNORMAL HIGH (ref 38–126)
Anion gap: 15 (ref 5–15)
BUN: 20 mg/dL (ref 6–20)
CO2: 19 mmol/L — ABNORMAL LOW (ref 22–32)
Calcium: 9 mg/dL (ref 8.9–10.3)
Chloride: 105 mmol/L (ref 98–111)
Creatinine, Ser: 1.03 mg/dL — ABNORMAL HIGH (ref 0.44–1.00)
GFR, Estimated: >60 mL/min (ref >60)
Glucose, Bld: 108 mg/dL — ABNORMAL HIGH (ref 70–99)
Potassium: 3.9 mmol/L (ref 3.5–5.1)
Sodium: 139 mmol/L (ref 135–145)
Total Bilirubin: 1.3 mg/dL — ABNORMAL HIGH (ref 0.3–1.2)
Total Protein: 8 g/dL (ref 6.5–8.1)

## 2020-09-26 LAB — RESP PANEL BY RT-PCR (FLU A&B, COVID) ARPGX2
Influenza A by PCR: NEGATIVE
Influenza B by PCR: NEGATIVE
SARS Coronavirus 2 by RT PCR: POSITIVE — AB

## 2020-09-26 LAB — CBC WITH DIFFERENTIAL/PLATELET
Abs Immature Granulocytes: 0.05 10*3/uL (ref 0.00–0.07)
Basophils Absolute: 0 10*3/uL (ref 0.0–0.1)
Basophils Relative: 0 %
Eosinophils Absolute: 0 10*3/uL (ref 0.0–0.5)
Eosinophils Relative: 0 %
HCT: 36.8 % (ref 36.0–46.0)
Hemoglobin: 11.5 g/dL — ABNORMAL LOW (ref 12.0–15.0)
Immature Granulocytes: 1 %
Lymphocytes Relative: 31 %
Lymphs Abs: 1.6 10*3/uL (ref 0.7–4.0)
MCH: 25.4 pg — ABNORMAL LOW (ref 26.0–34.0)
MCHC: 31.3 g/dL (ref 30.0–36.0)
MCV: 81.4 fL (ref 80.0–100.0)
Monocytes Absolute: 0.4 10*3/uL (ref 0.1–1.0)
Monocytes Relative: 8 %
Neutro Abs: 3.2 10*3/uL (ref 1.7–7.7)
Neutrophils Relative %: 60 %
Platelets: 317 10*3/uL (ref 150–400)
RBC: 4.52 MIL/uL (ref 3.87–5.11)
RDW: 16.7 % — ABNORMAL HIGH (ref 11.5–15.5)
WBC: 5.3 10*3/uL (ref 4.0–10.5)
nRBC: 0 % (ref 0.0–0.2)

## 2020-09-26 LAB — FIBRINOGEN: Fibrinogen: 494 mg/dL — ABNORMAL HIGH (ref 210–475)

## 2020-09-26 LAB — PROCALCITONIN: Procalcitonin: <0.1 ng/mL

## 2020-09-26 LAB — FERRITIN: Ferritin: 412 ng/mL — ABNORMAL HIGH (ref 11–307)

## 2020-09-26 LAB — D-DIMER, QUANTITATIVE: D-Dimer, Quant: 13.13 ug/mL-FEU — ABNORMAL HIGH (ref 0.00–0.50)

## 2020-09-26 LAB — TRIGLYCERIDES: Triglycerides: 342 mg/dL — ABNORMAL HIGH (ref <150)

## 2020-09-26 LAB — POC SARS CORONAVIRUS 2 AG -  ED: SARS Coronavirus 2 Ag: NEGATIVE

## 2020-09-26 LAB — LACTATE DEHYDROGENASE: LDH: 431 U/L — ABNORMAL HIGH (ref 98–192)

## 2020-09-26 LAB — LACTIC ACID, PLASMA: Lactic Acid, Venous: 1.6 mmol/L (ref 0.5–1.9)

## 2020-09-26 LAB — C-REACTIVE PROTEIN: CRP: 1.9 mg/dL — ABNORMAL HIGH (ref <1.0)

## 2020-09-26 MED ORDER — DEXAMETHASONE SODIUM PHOSPHATE 10 MG/ML IJ SOLN
6.0000 mg | Freq: Once | INTRAMUSCULAR | Status: AC
  Administered 2020-09-26: 6 mg via INTRAVENOUS
  Filled 2020-09-26: qty 1

## 2020-09-26 MED ORDER — IOHEXOL 350 MG/ML SOLN
100.0000 mL | Freq: Once | INTRAVENOUS | Status: AC | PRN
  Administered 2020-09-26: 100 mL via INTRAVENOUS

## 2020-09-26 MED ORDER — ALBUTEROL SULFATE HFA 108 (90 BASE) MCG/ACT IN AERS
8.0000 | INHALATION_SPRAY | Freq: Once | RESPIRATORY_TRACT | Status: AC
  Administered 2020-09-26: 8 via RESPIRATORY_TRACT
  Filled 2020-09-26: qty 6.7

## 2020-09-26 MED ORDER — SODIUM CHLORIDE 0.9 % IV SOLN
200.0000 mg | Freq: Once | INTRAVENOUS | Status: AC
  Administered 2020-09-26: 200 mg via INTRAVENOUS
  Filled 2020-09-26: qty 200

## 2020-09-26 MED ORDER — SODIUM CHLORIDE 0.9 % IV SOLN
100.0000 mg | Freq: Every day | INTRAVENOUS | Status: DC
  Administered 2020-09-27 – 2020-09-29 (×3): 100 mg via INTRAVENOUS
  Filled 2020-09-26 (×4): qty 20

## 2020-09-26 NOTE — ED Notes (Signed)
hospitialist in room

## 2020-09-26 NOTE — H&P (Signed)
History and Physical    CHRISTALYNN BOISE FWY:[id number] DOB: 12/20/1965 DOA: 09/26/2020  PCP: Patient, No Pcp Per   Patient coming from: Home  Chief Complaint: SOB  HPI: KIRRA VERGA is a 54 y.o. female with medical history significant for anemia and GERD who presents  with complaint of shortness of breath. Pt reports 12 days ago she began having cold like symptoms and she took a home COVID test which was positive. Her children were also having similar symptoms and tested positive for COVID. She states that since then her symptoms have worsened and she is now having shortness of breath. She also complains of diarrhea, fevers, lack of appetite. She has been taking OTC medications without relief. States that she called EMS 3 days ago for shortness of breath and had an O2 sat in the low 90s and at that time she did not want to be brought to the hospital so she stayed home.  Has become more she states that has been checking her oxygen since then and it was as low as 88% today prompting her to come to the hospital for evaluation.. Pt is unvaccinated.   Not go see her PCP after her positive Covid test and she did not receive monoclonal antibodies  ED Course: She is requiring supplemental oxygen.  Started on steroids and remdesivir in the emergency room  Review of Systems:  General: Reports fatigue and weakness.  Reports subjective fever, chills. Denies weight loss, night sweats.  Denies dizziness. Reports decreased appetite HENT: Denies head trauma, headache, denies change in hearing, tinnitus.  Denies nasal congestion or bleeding.  Denies sore throat, sores in mouth.  Denies difficulty swallowing Eyes: Denies blurry vision, pain in eye, drainage.  Denies discoloration of eyes. Neck: Denies pain.  Denies swelling.  Denies pain with movement. Cardiovascular: Denies chest pain, palpitations.  Denies edema.  Denies orthopnea Respiratory: Denies shortness of breath, cough.  Denies wheezing.   Denies sputum production Gastrointestinal: Reports nausea and diarrhea. Denies abdominal pain, swelling. Denies melena.  Denies hematemesis. Musculoskeletal: Denies limitation of movement.  Denies deformity or swelling.  Denies pain.  Denies arthralgias or myalgias. Genitourinary: Denies pelvic pain.  Denies urinary frequency or hesitancy.  Denies dysuria.  Skin: Denies rash.  Denies petechiae, purpura, ecchymosis. Neurological: Denies headache.  Denies syncope.  Denies seizure activity.  Denies weakness or paresthesia.  Denies slurred speech, drooping face.  Denies visual change. Psychiatric: Denies depression, anxiety.  Denies suicidal thoughts or ideation.  Denies hallucinations.  Past Medical History:  Diagnosis Date  . Anemia   . Anxiety and depression   . GERD (gastroesophageal reflux disease)   . Gestational diabetes   . 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 Vitamin D deficiency     Past Surgical History:  Procedure Laterality Date  . CHOLECYSTECTOMY  11/1994  . GYNECOLOGIC CRYOSURGERY  1992  . HAND SURGERY     left  . PELVIC LAPAROSCOPY  2000   RIGHT OVARIAN CYSTECTOMY  . TUBAL LIGATION  2004    Social History  reports that she is a non-smoker but has been exposed to tobacco smoke. She has never used smokeless tobacco. She reports that she does not drink alcohol and does not use drugs.  Allergies  Allergen Reactions  . Acetaminophen     SEIZURE LIKE SX'S  . Benzonatate [Tessalon Perles]     Chills, shaking, SOB, chest tightness    Family History  Problem Relation Age  of Onset  . Diabetes Mother   . Diabetes Father   . Asthma Son   . Colon cancer Neg Hx   . Lung disease Neg Hx      Prior to Admission medications   Medication Sig Start Date End Date Taking? Authorizing Provider  dextromethorphan-guaiFENesin (MUCINEX DM) 30-600 MG 12hr tablet Take 1 tablet by mouth 2 (two) times daily as needed for cough.   Yes [provider]   guaifenesin (ROBITUSSIN) 100 MG/5ML syrup Take 200 mg by mouth 3 (three) times daily as needed for cough or congestion.   Yes [provider]  ibuprofen (ADVIL) 200 MG tablet Take 200 mg by mouth every 6 (six) hours as needed for headache or fever.   Yes [provider]  Vitamin D, Ergocalciferol, (DRISDOL) 50000 units CAPS capsule Take 1 capsule (50,000 Units total) by mouth every 7 (seven) days. 03/20/16  Yes [name], MD    Physical Exam: Vitals:   09/26/20 2000 09/26/20 2015 09/26/20 2100 09/26/20 2200  BP: 119/72  126/76 121/68  Pulse: 64 76 70 66  Resp: 15 (!) 22 (!) 22 (!) 23  Temp:      TempSrc:      SpO2: 97% 96% 98% 97%  Weight:      Height:        Constitutional: NAD, calm, comfortable Vitals:   09/26/20 2000 09/26/20 2015 09/26/20 2100 09/26/20 2200  BP: 119/72  126/76 121/68  Pulse: 64 76 70 66  Resp: 15 (!) 22 (!) 22 (!) 23  Temp:      TempSrc:      SpO2: 97% 96% 98% 97%  Weight:      Height:       General: WDWN, Alert and oriented x3.  Eyes: EOMI, PERRL, conjunctivae normal.  Sclera nonicteric HENT:  [place]/AT, external ears normal.  Nares patent without epistasis.  Mucous membranes are moist. Posterior pharynx clear of any exudate or lesions. Neck: Soft, normal range of motion, supple, no masses, no thyromegaly.  Trachea midline Respiratory:   Sounds are diminished but equal.  Tachypnea.  No wheezing, no crackles. Normal respiratory effort. No accessory muscle use.  Cardiovascular: Regular rate and rhythm, no murmurs / rubs / gallops. No extremity edema.  Abdomen: Soft, no tenderness, nondistended, no rebound or guarding.  No masses palpated. No hepatosplenomegaly. Bowel sounds normoactive Musculoskeletal: FROM. no clubbing / cyanosis. No joint deformity upper and lower extremities. Normal muscle tone.  Skin: Warm, dry, intact no rashes, lesions, ulcers. No induration Neurologic: CN 2-12 grossly intact.  Normal speech.  Sensation intact,   Strength 5/5 in all extremities.   Psychiatric: Normal judgment and insight.  Normal mood.    Labs on Admission: I have personally reviewed following labs and imaging studies  CBC: Recent Labs  Lab 09/26/20 1601  WBC 5.3  NEUTROABS 3.2  HGB 11.5*  HCT 36.8  MCV 81.4  PLT 317    Basic Metabolic Panel: Recent Labs  Lab 09/26/20 1601  NA 139  K 3.9  CL 105  CO2 19*  GLUCOSE 108*  BUN 20  CREATININE 1.03*  CALCIUM 9.0    GFR: Estimated Creatinine Clearance: 72.6 mL/min (A) (by C-G formula based on SCr of 1.03 mg/dL (H)).  Liver Function Tests: Recent Labs  Lab 09/26/20 1601  AST 103*  ALT 67*  ALKPHOS 139*  BILITOT 1.3*  PROT 8.0  ALBUMIN 4.1    Urine analysis:    Component Value Date/Time  COLORURINE YELLOW 03/19/2016 0919   APPEARANCEUR CLEAR 03/19/2016 0919   LABSPEC 1.016 03/19/2016 0919   PHURINE 5.5 03/19/2016 0919   GLUCOSEU NEGATIVE 03/19/2016 0919   HGBUR NEGATIVE 03/19/2016 0919   BILIRUBINUR NEGATIVE 03/19/2016 0919   KETONESUR NEGATIVE 03/19/2016 0919   PROTEINUR NEGATIVE 03/19/2016 0919   UROBILINOGEN 0.2 06/22/2013 1227   NITRITE NEGATIVE 03/19/2016 0919   LEUKOCYTESUR NEGATIVE 03/19/2016 0919    Radiological Exams on Admission: DG Chest Port 1 View  Result Date: 09/26/2020 CLINICAL DATA:  COVID-19 diagnosed 09/19/2020, increasing shortness of breath EXAM: PORTABLE CHEST 1 VIEW COMPARISON:  03/19/2016 FINDINGS: Single frontal view of the chest demonstrates patchy bilateral ground-glass airspace disease greatest at the lung bases. No effusion or pneumothorax. Cardiac silhouette is unremarkable. No acute bony abnormalities. IMPRESSION: 1. Multifocal bilateral pneumonia compatible with COVID 19. Electronically Signed   By: Sharlet Salina M.D.   On: 09/26/2020 16:31    Assessment/Plan Principal Problem:   Pneumonia due to COVID-19 virus Ms. Davidovich is admitted to Med-Surg floor under protocols. She was started on remdesivir in the  emergency room which will be continued.  Remdesivir may or may not help much as it has been 12 days since her symptoms began but no other therapeutic options are available for her at this time. Solu-Medrol twice daily ordered per COVID-19 protocol Supplemental oxygen to keep O2 sat between 99 6%. Albuterol MDI with spacer every 4 hours as needed for shortness of breath, cough, wheeze Robitussin-DM ordered Supportive care  Active Problems:   Hypoxia Supplemental oxygen as above. Incentive spirometer every hour while awake    DVT prophylaxis: Lovenox for DVT prophylaxis.  Convert to full anticoagulation if PE identified Code Status:   Full code Family Communication:  Gnosis and plan discussed with patient.  She verbalized understanding and agrees with plan.  Further recommendations to follow as clinically indicated Disposition Plan:   Patient is from:  Home  Anticipated DC to:  Home  Anticipated DC date:  Anticipate greater than 2 midnight stay in the hospital to treat acute condition  Anticipated DC barriers: No barriers to discharge identified at this time   Admission status:  Inpatient   Claudean Severance Cleopatra Sardo MD [facility]  How to contact the [facility] Attending or Consulting provider 7A - 7P or covering provider during after hours 7P -7A, for this patient?   1. Check the care team in [facility] and look for a) attending/consulting [facility] provider listed and b) the [facility] team listed 2. Log into [email or web address] and use [place]'s universal password to access. If you do not have the password, please contact the hospital operator. 3. Locate the [facility] provider you are looking for under [facility] and page to a number that you can be directly reached. 4. If you still have difficulty reaching the provider, please page the [facility] (Director on Call) for the Hospitalists listed on amion for assistance.  09/26/2020, 10:35 PM

## 2020-09-26 NOTE — ED Provider Notes (Signed)
[facility]-EMERGENCY DEPT Provider Note   CSN: [id number] Arrival date & time: 09/26/20  1239     History Chief Complaint  Patient presents with  . Covid Positive  . Shortness of Breath    Krystal Barber is a 54 y.o. female with PMHx anemia and GERD who presents to the ED today with complaint of shortness of breath. Pt reports 12 days ago she began having cold like symptoms. Her children were also having similar symptoms and tested positive for COVID. Pt took an at home test on 12/23 which was positive. She states that since then her symptoms have worsened and she is now having shortness of breath. She also complains of diarrhea, fevers, lack of appetite. She has been taking OTC medications without relief. States that she called EMS 3 days ago for shortness of breath and had an O2 sat in the low 90s. She states that has been checking her oxygen since then and it was as low as 88% today prompting her to come back to the ED. Pt is unvaccinated.   The history is provided by the patient and medical records.       Past Medical History:  Diagnosis Date  . Anemia   . Anxiety and depression   . GERD (gastroesophageal reflux disease)   . Gestational diabetes   . 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 Vitamin D deficiency     Patient Active Problem List   Diagnosis Date Noted  . Chronic bronchitis (HCC) 06/04/2016  . H/O vitamin D deficiency 08/16/2014  . Headache 06/10/2014  . Numbness and tingling in right hand 06/10/2014  . Headache(784.0) 06/09/2014  . Disturbance of skin sensation 06/09/2014  . Insomnia 06/22/2013  . Tiredness 06/22/2013  . Perimenopausal symptoms 01/27/2012  . Restless leg syndrome 12/18/2011  . Cough 12/18/2011  . Anxiety 11/05/2011  . GERD 04/08/2009  . EARLY SATIETY 03/15/2009  . INTERNAL HEMORRHOIDS 07/09/2008  . EPIGASTRIC PAIN 07/09/2008    Past Surgical History:  Procedure Laterality Date  .  CHOLECYSTECTOMY  11/1994  . GYNECOLOGIC CRYOSURGERY  1992  . HAND SURGERY     left  . PELVIC LAPAROSCOPY  2000   RIGHT OVARIAN CYSTECTOMY  . TUBAL LIGATION  2004     OB History    Gravida  6   Para  4   Term  4   Preterm      AB  2   Living  4     SAB  2   IAB      Ectopic      Multiple      Live Births  4           Family History  Problem Relation Age of Onset  . Diabetes Mother   . Diabetes Father   . Asthma Son   . Colon cancer Neg Hx   . Lung disease Neg Hx     Social History   Tobacco Use  . Smoking status: Passive Smoke Exposure - Never Smoker  . Smokeless tobacco: Never Used  . Tobacco comment: Step-father  Substance Use Topics  . Alcohol use: No    Alcohol/week: 0.0 standard drinks  . Drug use: No    Home Medications Prior to Admission medications   Medication Sig Start Date End Date Taking? Authorizing Provider  dextromethorphan-guaiFENesin (MUCINEX DM) 30-600 MG 12hr tablet Take 1 tablet by mouth 2 (two) times daily as needed for cough.  Yes [provider]  ibuprofen (ADVIL) 200 MG tablet Take 200 mg by mouth every 6 (six) hours as needed for headache or fever.   Yes [provider]  Vitamin D, Ergocalciferol, (DRISDOL) 50000 units CAPS capsule Take 1 capsule (50,000 Units total) by mouth every 7 (seven) days. 03/20/16  Yes Ok EdwardsFernandez, Juan H, MD    Allergies    Acetaminophen and Benzonatate [tessalon perles]  Review of Systems   Review of Systems  Constitutional: Positive for appetite change, chills, fatigue and fever.  Respiratory: Positive for cough and shortness of breath.   Cardiovascular: Negative for chest pain.  Gastrointestinal: Positive for diarrhea.  All other systems reviewed and are negative.   Physical Exam Updated Vital Signs BP (!) 146/66 (BP Location: Left Arm)   Pulse 91   Temp 98.3 F (36.8 C) (Oral)   Resp 20   Ht 5' 3.5" (1.613 m)   Wt 99.5 kg   SpO2 91%   BMI 38.23 kg/m    Physical Exam Vitals and nursing note reviewed.  Constitutional:      Appearance: She is obese. She is diaphoretic. She is not ill-appearing.  HENT:     Head: Normocephalic and atraumatic.  Eyes:     Conjunctiva/sclera: Conjunctivae normal.  Cardiovascular:     Rate and Rhythm: Normal rate and regular rhythm.     Pulses: Normal pulses.  Pulmonary:     Effort: Tachypnea present.     Breath sounds: Decreased breath sounds present.     Comments: Speaking in one word sentences with tachypnea. Actively coughing in the room. Satting 88-90% on RA at rest; placed on 4L with improvement to 94%.  Abdominal:     Palpations: Abdomen is soft.     Tenderness: There is no abdominal tenderness. There is no guarding or rebound.  Musculoskeletal:     Cervical back: Neck supple.  Skin:    General: Skin is warm.  Neurological:     Mental Status: She is alert.     ED Results / Procedures / Treatments   Labs (all labs ordered are listed, but only abnormal results are displayed) Labs Reviewed  RESP PANEL BY RT-PCR (FLU A&B, COVID) ARPGX2 - Abnormal; Notable for the following components:      Result Value   SARS Coronavirus 2 by RT PCR POSITIVE (*)    All other components within normal limits  COMPREHENSIVE METABOLIC PANEL - Abnormal; Notable for the following components:   CO2 19 (*)    Glucose, Bld 108 (*)    Creatinine, Ser 1.03 (*)    AST 103 (*)    ALT 67 (*)    Alkaline Phosphatase 139 (*)    Total Bilirubin 1.3 (*)    All other components within normal limits  CBC WITH DIFFERENTIAL/PLATELET - Abnormal; Notable for the following components:   Hemoglobin 11.5 (*)    MCH 25.4 (*)    RDW 16.7 (*)    All other components within normal limits  D-DIMER, QUANTITATIVE (NOT AT [facility]) - Abnormal; Notable for the following components:   D-Dimer, Quant 13.13 (*)    All other components within normal limits  LACTATE DEHYDROGENASE - Abnormal; Notable for the following components:   LDH 431  (*)    All other components within normal limits  FERRITIN - Abnormal; Notable for the following components:   Ferritin 412 (*)    All other components within normal limits  TRIGLYCERIDES - Abnormal; Notable for the following components:  Triglycerides 342 (*)    All other components within normal limits  FIBRINOGEN - Abnormal; Notable for the following components:   Fibrinogen 494 (*)    All other components within normal limits  C-REACTIVE PROTEIN - Abnormal; Notable for the following components:   CRP 1.9 (*)    All other components within normal limits  CULTURE, BLOOD (ROUTINE X 2)  CULTURE, BLOOD (ROUTINE X 2)  LACTIC ACID, PLASMA  PROCALCITONIN  POC SARS CORONAVIRUS 2 AG -  ED    EKG None  Radiology DG Chest Port 1 View  Result Date: 09/26/2020 CLINICAL DATA:  COVID-19 diagnosed 09/19/2020, increasing shortness of breath EXAM: PORTABLE CHEST 1 VIEW COMPARISON:  03/19/2016 FINDINGS: Single frontal view of the chest demonstrates patchy bilateral ground-glass airspace disease greatest at the lung bases. No effusion or pneumothorax. Cardiac silhouette is unremarkable. No acute bony abnormalities. IMPRESSION: 1. Multifocal bilateral pneumonia compatible with COVID 19. Electronically Signed   By: Sharlet Salina M.D.   On: 09/26/2020 16:31    Procedures Procedures (including critical care time)  Medications Ordered in ED Medications  remdesivir 200 mg in sodium chloride 0.9% 250 mL IVPB (has no administration in time range)    Followed by  remdesivir 100 mg in sodium chloride 0.9 % 100 mL IVPB (has no administration in time range)  albuterol (VENTOLIN HFA) 108 (90 Base) MCG/ACT inhaler 8 puff (8 puffs Inhalation Given 09/26/20 1635)  dexamethasone (DECADRON) injection 6 mg (6 mg Intravenous Given 09/26/20 2107)    ED Course  I have reviewed the triage vital signs and the nursing notes.  Pertinent labs & imaging results that were available during my care of the patient  were reviewed by me and considered in my medical decision making (see chart for details).  Clinical Course as of 09/26/20 2115  Thu Sep 26, 2020  2015 SARS Coronavirus 2 by RT PCR(!): POSITIVE [MV]    Clinical Course User Index [MV] Tanda Rockers, PA-C   MDM Rules/Calculators/A&P                          55 year old female who is unvaccinated for COVID-19 who presents to the ED today with worsening shortness of breath for the past several days.  Had an at home positive test on 12/23, has had symptoms for the past 12 days.  States that she checked her oxygen saturation today and it was as low as 88% prompting her to come to the ED.  On arrival to the ED patient is afebrile, nontachycardic and nontachypneic.  When she is placed back in the room she appears to be working hard to breathe and is able to answer in one-word sentences.  She is satting between 88 to 90% on room air at rest, I have placed her on 4 L with improvement to 94%.  We will plan to ambulate patient when she is in proved to see if she desats however given she was teetering between 88 to 90% at rest I do suspect that she will desat she will need to come into the ED.  Will provide albuterol inhaler at this time, check labs, test for Covid given we do not have 1 in our system.   CXR with findings consistent with COVID pneumonia.  CBC without leukocytosis. Hgb 11.5 which is improved from previous baseline.   Pt ambulated without O2 after albuterol treatment and desatted to 87% with more increased work of breathing. PT will need to  be admitted. Covid order set initiated.   POC Covid test has returned negative. Will perform PCR.   Fibrinogen, LDH, Ferritin, CRP, and D dimer elevated. COVID test still pending.   COVID test has returned positive. Will admit.   Discussed case with Dr. Rachael Darby [facility] who agrees to evaluate patient for admission.   This note was prepared using Dragon voice recognition software and may  include unintentional dictation errors due to the inherent limitations of voice recognition software.  Krystal Barber was evaluated in Emergency Department on 09/26/2020 for the symptoms described in the history of present illness. She was evaluated in the context of the global COVID-19 pandemic, which necessitated consideration that the patient might be at risk for infection with the SARS-CoV-2 virus that causes COVID-19. Institutional protocols and algorithms that pertain to the evaluation of patients at risk for COVID-19 are in a state of rapid change based on information released by regulatory bodies including the CDC and federal and state organizations. These policies and algorithms were followed during the patient's care in the ED.   Final Clinical Impression(s) / ED Diagnoses Final diagnoses:  COVID-19  Hypoxia    Rx / DC Orders ED Discharge Orders    None       Tanda Rockers, Cordelia Poche 09/26/20 2115    Arby Barrette, MD 09/27/20 1041

## 2020-09-26 NOTE — ED Notes (Signed)
Patient stated that she is not able to stand at this time to have her pulse oximetry taken at this time. Patient stated that she starts to cough excessively when ambulating

## 2020-09-26 NOTE — ED Triage Notes (Signed)
Pt states that she was dx with Covid on 12/23 and has had increased SOB. No relief w/ OTC meds. Alert and oriented.

## 2020-09-26 NOTE — ED Notes (Signed)
Patient is resting comfortably. 

## 2020-09-26 NOTE — ED Notes (Addendum)
Pt ambulatory pulse ox 87% RA, increase in WOB

## 2020-09-26 NOTE — ED Notes (Signed)
External female catheter (purewick) placed on patient

## 2020-09-26 NOTE — ED Notes (Signed)
Attempted x2, unable to obtain repeat lactic at this time.

## 2020-09-27 DIAGNOSIS — J1282 Pneumonia due to coronavirus disease 2019: Secondary | ICD-10-CM

## 2020-09-27 DIAGNOSIS — U071 COVID-19: Principal | ICD-10-CM

## 2020-09-27 LAB — CBC
HCT: 32.7 % — ABNORMAL LOW (ref 36.0–46.0)
Hemoglobin: 10.6 g/dL — ABNORMAL LOW (ref 12.0–15.0)
MCH: 25.9 pg — ABNORMAL LOW (ref 26.0–34.0)
MCHC: 32.4 g/dL (ref 30.0–36.0)
MCV: 80 fL (ref 80.0–100.0)
Platelets: 287 10*3/uL (ref 150–400)
RBC: 4.09 MIL/uL (ref 3.87–5.11)
RDW: 16.5 % — ABNORMAL HIGH (ref 11.5–15.5)
WBC: 2.7 10*3/uL — ABNORMAL LOW (ref 4.0–10.5)
nRBC: 0 % (ref 0.0–0.2)

## 2020-09-27 LAB — HIV ANTIBODY (ROUTINE TESTING W REFLEX): HIV Screen 4th Generation wRfx: NONREACTIVE

## 2020-09-27 LAB — CREATININE, SERUM
Creatinine, Ser: 0.87 mg/dL (ref 0.44–1.00)
GFR, Estimated: >60 mL/min (ref >60)

## 2020-09-27 MED ORDER — ONDANSETRON HCL 4 MG PO TABS: 4.0000 mg | ORAL_TABLET | Freq: Four times a day (QID) | ORAL | Status: DC | PRN

## 2020-09-27 MED ORDER — METHYLPREDNISOLONE SODIUM SUCC 125 MG IJ SOLR
1.0000 mg/kg | Freq: Two times a day (BID) | INTRAMUSCULAR | Status: DC
Start: 2020-09-26 — End: 2020-09-29
  Administered 2020-09-27 – 2020-09-29 (×5): 99.375 mg via INTRAVENOUS
  Filled 2020-09-27 (×5): qty 2

## 2020-09-27 MED ORDER — ALBUTEROL SULFATE HFA 108 (90 BASE) MCG/ACT IN AERS
2.0000 | INHALATION_SPRAY | RESPIRATORY_TRACT | Status: DC | PRN
  Administered 2020-09-28 (×2): 2 via RESPIRATORY_TRACT

## 2020-09-27 MED ORDER — IBUPROFEN 200 MG PO TABS
600.0000 mg | ORAL_TABLET | Freq: Four times a day (QID) | ORAL | Status: DC | PRN
  Administered 2020-09-28 (×2): 600 mg via ORAL
  Filled 2020-09-27 (×3): qty 3

## 2020-09-27 MED ORDER — ONDANSETRON HCL 4 MG/2ML IJ SOLN
4.0000 mg | Freq: Four times a day (QID) | INTRAMUSCULAR | Status: DC | PRN
  Filled 2020-09-27: qty 2

## 2020-09-27 MED ORDER — PREDNISONE 50 MG PO TABS: 50.0000 mg | ORAL_TABLET | Freq: Every day | ORAL | Status: DC

## 2020-09-27 MED ORDER — LACTATED RINGERS IV SOLN: INTRAVENOUS | Status: DC

## 2020-09-27 MED ORDER — ENOXAPARIN SODIUM 40 MG/0.4ML ~~LOC~~ SOLN
40.0000 mg | SUBCUTANEOUS | Status: DC
  Administered 2020-09-27 – 2020-09-28 (×2): 40 mg via SUBCUTANEOUS
  Filled 2020-09-27 (×2): qty 0.4

## 2020-09-27 MED ORDER — GUAIFENESIN-DM 100-10 MG/5ML PO SYRP
10.0000 mL | ORAL_SOLUTION | ORAL | Status: DC | PRN
  Administered 2020-09-28 – 2020-09-29 (×4): 10 mL via ORAL
  Filled 2020-09-27 (×5): qty 10

## 2020-09-27 MED ORDER — SENNOSIDES-DOCUSATE SODIUM 8.6-50 MG PO TABS: 1.0000 | ORAL_TABLET | Freq: Every evening | ORAL | Status: DC | PRN

## 2020-09-27 NOTE — ED Notes (Signed)
Patient is resting comfortably. 

## 2020-09-27 NOTE — Progress Notes (Signed)
PROGRESS NOTE    Krystal Barber  PZW:[id number] DOB: [birth year] DOA: 09/26/2020 PCP: Patient, No Pcp Per   Brief Narrative: HPI per Dr. Rachael Darby  09/26/2020 Krystal Barber is a 54 y.o. female with medical history significant for anemia and GERD who presents  with complaint of shortness of breath. Pt reports 12 days ago she began having cold like symptoms and she took a home COVID test which was positive. Her children were also having similar symptoms and tested positive for COVID. She states that since then her symptoms have worsened and she is now having shortness of breath. She also complains of diarrhea, fevers, lack of appetite. She has been taking OTC medications without relief. States that she called EMS 3 days ago for shortness of breath and had an O2 sat in the low 90s and at that time she did not want to be brought to the hospital so she stayed home.  Has become more she states that has been checking her oxygen since then and it was as low as 88% today prompting her to come to the hospital for evaluation.. Pt is unvaccinated.  Not go see her PCP after her positive Covid test and she did not receive monoclonal antibodies  Assessment & Plan:   Principal Problem:   Pneumonia due to COVID-19 virus Active Problems:   Hypoxia  #1 acute hypoxic respiratory failure secondary to Covid pneumonia-present on admission. She was saturating 88% on room air prior to admission. CRP 1.9 Lactic acid 1.6 Pro-Cal less than 0.10 White count 2.7 Normal renal functions D-dimer is 13.13 Patient has been started on remdesivir continue and finish the course Continue IV steroids Continue Lovenox, incentive spirometer Encourage patient to use incentive spirometer out of bed as tolerated She is saturating 94% on 2 L.    Estimated body mass index is 38.23 kg/m as calculated from the following:   Height as of this encounter: 5' 3.5" (1.613 m).   Weight as of this encounter: 99.5 kg.  DVT  prophylaxis: Lovenox Code Status: Full code Family Communication: None at bedside she did not wanted me to update anybody. Disposition Plan:  Status is: Inpatient  Dispo: The patient is from: Home              Anticipated d/c is to: Home              Anticipated d/c date is: 2 days              Patient currently is not medically stable to d/c.   Consultants:   None  Procedures: None Antimicrobials: None  Subjective: She is resting in bed coughing with deep breathing  Objective: Vitals:   09/27/20 0630 09/27/20 0730 09/27/20 0900 09/27/20 1015  BP: 111/69 115/72 120/68   Pulse: 62 62 88 77  Resp:  20 19   Temp:      TempSrc:      SpO2: 99% 95% 94% 94%  Weight:      Height:       No intake or output data in the 24 hours ending 09/27/20 1155 Filed Weights   09/26/20 1311  Weight: 99.5 kg    Examination:  General exam: Appears calm and comfortable  Respiratory system: Wheezing to auscultation. Respiratory effort normal. Cardiovascular system: S1 & S2 heard, RRR. No JVD, murmurs, rubs, gallops or clicks. No pedal edema. Gastrointestinal system: Abdomen is nondistended, soft and nontender. No organomegaly or masses felt. Normal bowel sounds heard. Central nervous system:  Alert and oriented. No focal neurological deficits. Extremities: Symmetric 5 x 5 power. Skin: No rashes, lesions or ulcers Psychiatry: Judgement and insight appear normal. Mood & affect appropriate.     Data Reviewed: I have personally reviewed following labs and imaging studies  CBC: Recent Labs  Lab 09/26/20 1601 09/27/20 0525  WBC 5.3 2.7*  NEUTROABS 3.2  --   HGB 11.5* 10.6*  HCT 36.8 32.7*  MCV 81.4 80.0  PLT 317 287   Basic Metabolic Panel: Recent Labs  Lab 09/26/20 1601 09/27/20 0525  NA 139  --   K 3.9  --   CL 105  --   CO2 19*  --   GLUCOSE 108*  --   BUN 20  --   CREATININE 1.03* 0.87  CALCIUM 9.0  --    GFR: Estimated Creatinine Clearance: 86 mL/min (by C-G formula  based on SCr of 0.87 mg/dL). Liver Function Tests: Recent Labs  Lab 09/26/20 1601  AST 103*  ALT 67*  ALKPHOS 139*  BILITOT 1.3*  PROT 8.0  ALBUMIN 4.1   No results for input(s): LIPASE, AMYLASE in the last 168 hours. No results for input(s): AMMONIA in the last 168 hours. Coagulation Profile: No results for input(s): INR, PROTIME in the last 168 hours. Cardiac Enzymes: No results for input(s): CKTOTAL, CKMB, CKMBINDEX, TROPONINI in the last 168 hours. BNP (last 3 results) No results for input(s): PROBNP in the last 8760 hours. HbA1C: No results for input(s): HGBA1C in the last 72 hours. CBG: No results for input(s): GLUCAP in the last 168 hours. Lipid Profile: Recent Labs    09/26/20 1601  TRIG 342*   Thyroid Function Tests: No results for input(s): TSH, T4TOTAL, FREET4, T3FREE, THYROIDAB in the last 72 hours. Anemia Panel: Recent Labs    09/26/20 1601  FERRITIN 412*   Sepsis Labs: Recent Labs  Lab 09/26/20 1601  PROCALCITON <0.10  LATICACIDVEN 1.6    Recent Results (from the past 240 hour(s))  Resp Panel by RT-PCR (Flu A&B, Covid) Nasopharyngeal Swab     Status: Abnormal   Collection Time: 09/26/20  6:14 PM   Specimen: Nasopharyngeal Swab; Nasopharyngeal(NP) swabs in vial transport medium  Result Value Ref Range Status   SARS Coronavirus 2 by RT PCR POSITIVE (A) NEGATIVE Final    Comment: RESULT CALLED TO, READ BACK BY AND VERIFIED WITH: M.VENTER, PA AT 2014 ON 09/26/20 BY N.THOMPSON (NOTE) SARS-CoV-2 target nucleic acids are DETECTED.  The SARS-CoV-2 RNA is generally detectable in upper respiratory specimens during the acute phase of infection. Positive results are indicative of the presence of the identified virus, but do not rule out bacterial infection or co-infection with other pathogens not detected by the test. Clinical correlation with patient history and other diagnostic information is necessary to determine patient infection status. The  expected result is Negative.  Fact Sheet for Patients: BloggerCourse.com  Fact Sheet for Healthcare Providers: SeriousBroker.it  This test is not yet approved or cleared by the [place] FDA and  has been authorized for detection and/or diagnosis of SARS-CoV-2 by FDA under an Emergency Use Authorization (EUA).  This EUA will remain in effect (meaning thi s test can be used) for the duration of  the COVID-19 declaration under Section 564(b)(1) of the Act, 21 U.S.C. section 360bbb-3(b)(1), unless the authorization is terminated or revoked sooner.     Influenza A by PCR NEGATIVE NEGATIVE Final   Influenza B by PCR NEGATIVE NEGATIVE Final    Comment: (NOTE) The  Xpert Xpress SARS-CoV-2/FLU/RSV plus assay is intended as an aid in the diagnosis of influenza from Nasopharyngeal swab specimens and should not be used as a sole basis for treatment. Nasal washings and aspirates are unacceptable for Xpert Xpress SARS-CoV-2/FLU/RSV testing.  Fact Sheet for Patients: BloggerCourse.com  Fact Sheet for Healthcare Providers: SeriousBroker.it  This test is not yet approved or cleared by the [place] FDA and has been authorized for detection and/or diagnosis of SARS-CoV-2 by FDA under an Emergency Use Authorization (EUA). This EUA will remain in effect (meaning this test can be used) for the duration of the COVID-19 declaration under Section 564(b)(1) of the Act, 21 U.S.C. section 360bbb-3(b)(1), unless the authorization is terminated or revoked.  Performed at [facility], [address], [place], [place] [address]   Blood Culture (routine x 2)     Status: None (Preliminary result)   Collection Time: 09/26/20  6:30 PM   Specimen: BLOOD  Result Value Ref Range Status   Specimen Description   Final    BLOOD RIGHT ARM Performed at [facility],  [address] [place], [place] [address]    Special Requests   Final    BOTTLES DRAWN AEROBIC AND ANAEROBIC Blood Culture adequate volume Performed at [facility], [address] [place], [place] [address]    Culture   Final    NO GROWTH < 12 HOURS Performed at [facility], [address] [place], [place] [address]    Report Status PENDING  Incomplete         Radiology Studies: CT ANGIO CHEST PE W OR WO CONTRAST  Result Date: 09/26/2020 CLINICAL DATA:  Positive D-dimer. Shortness of breath and hypoxia. Diagnosed with COVID on 12/23. Pulmonary embolus is suspected with low to intermediate probability. EXAM: CT ANGIOGRAPHY CHEST WITH CONTRAST TECHNIQUE: Multidetector CT imaging of the chest was performed using the standard protocol during bolus administration of intravenous contrast. Multiplanar CT image reconstructions and MIPs were obtained to evaluate the vascular anatomy. CONTRAST:  OMNIPAQUE IOHEXOL 350 MG/ML SOLN COMPARISON:  None. FINDINGS: Cardiovascular: Motion artifact limits examination. There is good opacification with moderately good visualization of the central and proximal segmental pulmonary arteries. No focal filling defects are demonstrated. No evidence of significant central pulmonary embolus. Smaller peripheral emboli could be obscured. Normal heart size. No pericardial effusions. Normal caliber thoracic aorta. No evidence of significant aortic dissection. Great vessel origins are patent. Mediastinum/Nodes: Esophagus is decompressed. Scattered mediastinal lymph nodes are not pathologically enlarged. Lungs/Pleura: Patchy nodular airspace disease in a mostly peripheral distribution in the lungs bilaterally. Changes are consistent with COVID pneumonia. No pleural effusions. No pneumothorax. Airways are patent. Upper Abdomen: No acute abnormalities demonstrated in the visualized upper abdomen. Musculoskeletal: Degenerative changes in the spine.  Review of the MIP images confirms the above findings. IMPRESSION: 1. No evidence of significant central pulmonary embolus. 2. Patchy nodular airspace disease in the lungs bilaterally consistent with COVID pneumonia. Electronically Signed   By: Burman Nieves M.D.   On: 09/26/2020 22:56   DG Chest Port 1 View  Result Date: 09/26/2020 CLINICAL DATA:  COVID-19 diagnosed 09/19/2020, increasing shortness of breath EXAM: PORTABLE CHEST 1 VIEW COMPARISON:  03/19/2016 FINDINGS: Single frontal view of the chest demonstrates patchy bilateral ground-glass airspace disease greatest at the lung bases. No effusion or pneumothorax. Cardiac silhouette is unremarkable. No acute bony abnormalities. IMPRESSION: 1. Multifocal bilateral pneumonia compatible with COVID 19. Electronically Signed   By: Casimiro Needle  Manson PasseyBrown M.D.   On: 09/26/2020 16:31        Scheduled Meds: . enoxaparin (LOVENOX) injection  40 mg Subcutaneous Q24H  . methylPREDNISolone (SOLU-MEDROL) injection  1 mg/kg Intravenous Q12H   Followed by  . [START ON 09/30/2020] predniSONE  50 mg Oral Daily   Continuous Infusions: . lactated ringers 125 mL/hr at 09/27/20 0530  . remdesivir 100 mg in NS 100 mL 100 mg (09/27/20 0957)     LOS: 1 day   Alwyn RenElizabeth G Jakaria Lavergne, MD 09/27/2020, 11:55 AM

## 2020-09-28 ENCOUNTER — Other Ambulatory Visit: Payer: Self-pay

## 2020-09-28 DIAGNOSIS — U071 COVID-19: Secondary | ICD-10-CM | POA: Diagnosis not present

## 2020-09-28 DIAGNOSIS — J1282 Pneumonia due to coronavirus disease 2019: Secondary | ICD-10-CM | POA: Diagnosis not present

## 2020-09-28 LAB — COMPREHENSIVE METABOLIC PANEL
ALT: 53 U/L — ABNORMAL HIGH (ref 0–44)
AST: 33 U/L (ref 15–41)
Albumin: 3.6 g/dL (ref 3.5–5.0)
Alkaline Phosphatase: 116 U/L (ref 38–126)
Anion gap: 11 (ref 5–15)
BUN: 24 mg/dL — ABNORMAL HIGH (ref 6–20)
CO2: 21 mmol/L — ABNORMAL LOW (ref 22–32)
Calcium: 9.1 mg/dL (ref 8.9–10.3)
Chloride: 108 mmol/L (ref 98–111)
Creatinine, Ser: 0.88 mg/dL (ref 0.44–1.00)
GFR, Estimated: >60 mL/min (ref >60)
Glucose, Bld: 218 mg/dL — ABNORMAL HIGH (ref 70–99)
Potassium: 3.8 mmol/L (ref 3.5–5.1)
Sodium: 140 mmol/L (ref 135–145)
Total Bilirubin: 0.4 mg/dL (ref 0.3–1.2)
Total Protein: 6.9 g/dL (ref 6.5–8.1)

## 2020-09-28 LAB — HEMOGLOBIN A1C
Hgb A1c MFr Bld: 5.2 % (ref 4.8–5.6)
Mean Plasma Glucose: 102.54 mg/dL

## 2020-09-28 LAB — CBC WITH DIFFERENTIAL/PLATELET
Abs Immature Granulocytes: 0.08 10*3/uL — ABNORMAL HIGH (ref 0.00–0.07)
Basophils Absolute: 0 10*3/uL (ref 0.0–0.1)
Basophils Relative: 0 %
Eosinophils Absolute: 0 10*3/uL (ref 0.0–0.5)
Eosinophils Relative: 0 %
HCT: 32.6 % — ABNORMAL LOW (ref 36.0–46.0)
Hemoglobin: 10.2 g/dL — ABNORMAL LOW (ref 12.0–15.0)
Immature Granulocytes: 2 %
Lymphocytes Relative: 21 %
Lymphs Abs: 1 10*3/uL (ref 0.7–4.0)
MCH: 25.5 pg — ABNORMAL LOW (ref 26.0–34.0)
MCHC: 31.3 g/dL (ref 30.0–36.0)
MCV: 81.5 fL (ref 80.0–100.0)
Monocytes Absolute: 0.2 10*3/uL (ref 0.1–1.0)
Monocytes Relative: 5 %
Neutro Abs: 3.4 10*3/uL (ref 1.7–7.7)
Neutrophils Relative %: 72 %
Platelets: 335 10*3/uL (ref 150–400)
RBC: 4 MIL/uL (ref 3.87–5.11)
RDW: 16.5 % — ABNORMAL HIGH (ref 11.5–15.5)
WBC: 4.7 10*3/uL (ref 4.0–10.5)
nRBC: 0 % (ref 0.0–0.2)

## 2020-09-28 LAB — D-DIMER, QUANTITATIVE: D-Dimer, Quant: 0.52 ug/mL-FEU — ABNORMAL HIGH (ref 0.00–0.50)

## 2020-09-28 LAB — CBG MONITORING, ED
Glucose-Capillary: 197 mg/dL — ABNORMAL HIGH (ref 70–99)
Glucose-Capillary: 210 mg/dL — ABNORMAL HIGH (ref 70–99)
Glucose-Capillary: 225 mg/dL — ABNORMAL HIGH (ref 70–99)

## 2020-09-28 LAB — FERRITIN: Ferritin: 338 ng/mL — ABNORMAL HIGH (ref 11–307)

## 2020-09-28 LAB — C-REACTIVE PROTEIN: CRP: 1.1 mg/dL — ABNORMAL HIGH (ref <1.0)

## 2020-09-28 MED ORDER — ASCORBIC ACID 500 MG PO TABS
1000.0000 mg | ORAL_TABLET | Freq: Every day | ORAL | Status: DC
  Administered 2020-09-28 – 2020-09-29 (×2): 1000 mg via ORAL
  Filled 2020-09-28 (×2): qty 2

## 2020-09-28 MED ORDER — ENOXAPARIN SODIUM 60 MG/0.6ML ~~LOC~~ SOLN
50.0000 mg | SUBCUTANEOUS | Status: DC
  Administered 2020-09-29: 50 mg via SUBCUTANEOUS
  Filled 2020-09-28: qty 0.5

## 2020-09-28 MED ORDER — ZINC SULFATE 220 (50 ZN) MG PO CAPS
220.0000 mg | ORAL_CAPSULE | Freq: Every day | ORAL | Status: DC
  Administered 2020-09-28 – 2020-09-29 (×2): 220 mg via ORAL
  Filled 2020-09-28 (×2): qty 1

## 2020-09-28 MED ORDER — VITAMIN D 25 MCG (1000 UNIT) PO TABS
1000.0000 [IU] | ORAL_TABLET | Freq: Every day | ORAL | Status: DC
  Administered 2020-09-28 – 2020-09-29 (×2): 1000 [IU] via ORAL
  Filled 2020-09-28 (×2): qty 1

## 2020-09-28 MED ORDER — INSULIN ASPART 100 UNIT/ML ~~LOC~~ SOLN
0.0000 [IU] | Freq: Three times a day (TID) | SUBCUTANEOUS | Status: DC
  Administered 2020-09-28 – 2020-09-29 (×4): 7 [IU] via SUBCUTANEOUS
  Filled 2020-09-28: qty 0.2

## 2020-09-28 NOTE — ED Notes (Signed)
Pt given IS and taught how to use

## 2020-09-28 NOTE — Progress Notes (Addendum)
PROGRESS NOTE    Krystal Barber  PJK:[id number] DOB: 03-Jul-1966 DOA: 09/26/2020 PCP: Patient, No Pcp Per   Brief Narrative: HPI per Dr. Rachael Darby  09/26/2020 Krystal Barber is a 55 y.o. female with medical history significant for anemia and GERD who presents  with complaint of shortness of breath. Pt reports 12 days ago she began having cold like symptoms and she took a home COVID test which was positive. Her children were also having similar symptoms and tested positive for COVID. She states that since then her symptoms have worsened and she is now having shortness of breath. She also complains of diarrhea, fevers, lack of appetite. She has been taking OTC medications without relief. States that she called EMS 3 days ago for shortness of breath and had an O2 sat in the low 90s and at that time she did not want to be brought to the hospital so she stayed home.  Has become more she states that has been checking her oxygen since then and it was as low as 88% today prompting her to come to the hospital for evaluation.. Pt is unvaccinated.  Not go see her PCP after her positive Covid test and she did not receive monoclonal antibodies  Assessment & Plan:   Principal Problem:   Pneumonia due to COVID-19 virus Active Problems:   Hypoxia  #1 acute hypoxic respiratory failure secondary to Covid pneumonia-present on admission. She was saturating 88% on room air prior to admission. CRP 1.1 from 1.9 Lactic acid 1.6 Pro-Cal less than 0.10 White count 4.7 Normal renal functions D-dimer is 0.52 from 13.13 Patient has been started on remdesivir continue and finish the course Continue IV steroids Continue Lovenox, incentive spirometer Encourage patient to use incentive spirometer out of bed as tolerated She is saturating 91% on 2 L.  #2 steroid-induced hyperglycemia cover with SSI.  Check A1c.  Estimated body mass index is 38.23 kg/m as calculated from the following:   Height as of this  encounter: 5' 3.5" (1.613 m).   Weight as of this encounter: 99.5 kg.  DVT prophylaxis: Lovenox Code Status: Full code Family Communication: None at bedside she did not wanted me to update anybody. Disposition Plan:  Status is: Inpatient  Dispo: The patient is from: Home              Anticipated d/c is to: Home              Anticipated d/c date is: 2 days              Patient currently is not medically stable to d/c.   Consultants:   None  Procedures: None Antimicrobials: None  Subjective: She is resting in bed coughing with deep breathing  Objective: Vitals:   09/28/20 0630 09/28/20 0700 09/28/20 0800 09/28/20 1015  BP: 123/69 121/70  130/68  Pulse: 62 64  (!) 105  Resp: 17 16  17   Temp:      TempSrc:      SpO2: 91% 92% 92% 91%  Weight:      Height:        Intake/Output Summary (Last 24 hours) at 09/28/2020 1303 Last data filed at 09/28/2020 0855 Gross per 24 hour  Intake 659.83 ml  Output --  Net 659.83 ml   Filed Weights   09/26/20 1311  Weight: 99.5 kg    Examination:  General exam: Appears calm and comfortable  Respiratory system: Wheezing to auscultation. Respiratory effort normal. Cardiovascular system: S1 &  S2 heard, RRR. No JVD, murmurs, rubs, gallops or clicks. No pedal edema. Gastrointestinal system: Abdomen is nondistended, soft and nontender. No organomegaly or masses felt. Normal bowel sounds heard. Central nervous system: Alert and oriented. No focal neurological deficits. Extremities: Symmetric 5 x 5 power. Skin: No rashes, lesions or ulcers Psychiatry: Judgement and insight appear normal. Mood & affect appropriate.     Data Reviewed: I have personally reviewed following labs and imaging studies  CBC: Recent Labs  Lab 09/26/20 1601 09/27/20 0525 09/28/20 0410  WBC 5.3 2.7* 4.7  NEUTROABS 3.2  --  3.4  HGB 11.5* 10.6* 10.2*  HCT 36.8 32.7* 32.6*  MCV 81.4 80.0 81.5  PLT 317 287 335   Basic Metabolic Panel: Recent Labs  Lab  09/26/20 1601 09/27/20 0525 09/28/20 0410  NA 139  --  140  K 3.9  --  3.8  CL 105  --  108  CO2 19*  --  21*  GLUCOSE 108*  --  218*  BUN 20  --  24*  CREATININE 1.03* 0.87 0.88  CALCIUM 9.0  --  9.1   GFR: Estimated Creatinine Clearance: 85 mL/min (by C-G formula based on SCr of 0.88 mg/dL). Liver Function Tests: Recent Labs  Lab 09/26/20 1601 09/28/20 0410  AST 103* 33  ALT 67* 53*  ALKPHOS 139* 116  BILITOT 1.3* 0.4  PROT 8.0 6.9  ALBUMIN 4.1 3.6   No results for input(s): LIPASE, AMYLASE in the last 168 hours. No results for input(s): AMMONIA in the last 168 hours. Coagulation Profile: No results for input(s): INR, PROTIME in the last 168 hours. Cardiac Enzymes: No results for input(s): CKTOTAL, CKMB, CKMBINDEX, TROPONINI in the last 168 hours. BNP (last 3 results) No results for input(s): PROBNP in the last 8760 hours. HbA1C: No results for input(s): HGBA1C in the last 72 hours. CBG: No results for input(s): GLUCAP in the last 168 hours. Lipid Profile: Recent Labs    09/26/20 1601  TRIG 342*   Thyroid Function Tests: No results for input(s): TSH, T4TOTAL, FREET4, T3FREE, THYROIDAB in the last 72 hours. Anemia Panel: Recent Labs    09/26/20 1601 09/28/20 0410  FERRITIN 412* 338*   Sepsis Labs: Recent Labs  Lab 09/26/20 1601  PROCALCITON <0.10  LATICACIDVEN 1.6    Recent Results (from the past 240 hour(s))  Resp Panel by RT-PCR (Flu A&B, Covid) Nasopharyngeal Swab     Status: Abnormal   Collection Time: 09/26/20  6:14 PM   Specimen: Nasopharyngeal Swab; Nasopharyngeal(NP) swabs in vial transport medium  Result Value Ref Range Status   SARS Coronavirus 2 by RT PCR POSITIVE (A) NEGATIVE Final    Comment: RESULT CALLED TO, READ BACK BY AND VERIFIED WITH: M.VENTER, PA AT 2014 ON 09/26/20 BY N.THOMPSON (NOTE) SARS-CoV-2 target nucleic acids are DETECTED.  The SARS-CoV-2 RNA is generally detectable in upper respiratory specimens during the acute  phase of infection. Positive results are indicative of the presence of the identified virus, but do not rule out bacterial infection or co-infection with other pathogens not detected by the test. Clinical correlation with patient history and other diagnostic information is necessary to determine patient infection status. The expected result is Negative.  Fact Sheet for Patients: BloggerCourse.com  Fact Sheet for Healthcare Providers: SeriousBroker.it  This test is not yet approved or cleared by the [place] FDA and  has been authorized for detection and/or diagnosis of SARS-CoV-2 by FDA under an Emergency Use Authorization (EUA).  This EUA will  remain in effect (meaning thi s test can be used) for the duration of  the COVID-19 declaration under Section 564(b)(1) of the Act, 21 U.S.C. section 360bbb-3(b)(1), unless the authorization is terminated or revoked sooner.     Influenza A by PCR NEGATIVE NEGATIVE Final   Influenza B by PCR NEGATIVE NEGATIVE Final    Comment: (NOTE) The Xpert Xpress SARS-CoV-2/FLU/RSV plus assay is intended as an aid in the diagnosis of influenza from Nasopharyngeal swab specimens and should not be used as a sole basis for treatment. Nasal washings and aspirates are unacceptable for Xpert Xpress SARS-CoV-2/FLU/RSV testing.  Fact Sheet for Patients: EntrepreneurPulse.com.au  Fact Sheet for Healthcare Providers: IncredibleEmployment.be  This test is not yet approved or cleared by the [place] FDA and has been authorized for detection and/or diagnosis of SARS-CoV-2 by FDA under an Emergency Use Authorization (EUA). This EUA will remain in effect (meaning this test can be used) for the duration of the COVID-19 declaration under Section 564(b)(1) of the Act, 21 U.S.C. section 360bbb-3(b)(1), unless the authorization is terminated or revoked.  Performed at  [facility], [place] [address] [place], [place] [address]   Blood Culture (routine x 2)     Status: None (Preliminary result)   Collection Time: 09/26/20  6:30 PM   Specimen: BLOOD  Result Value Ref Range Status   Specimen Description   Final    BLOOD RIGHT ARM Performed at [place] [address] [place], [place] [address]    Special Requests   Final    BOTTLES DRAWN AEROBIC AND ANAEROBIC Blood Culture adequate volume Performed at [facility] [address] [place], [place] [address]    Culture   Final    NO GROWTH 2 DAYS Performed at [facility] [address] [place], [place] [address]    Report Status PENDING  Incomplete         Radiology Studies: CT ANGIO CHEST PE W OR WO CONTRAST  Result Date: 09/26/2020 CLINICAL DATA:  Positive D-dimer. Shortness of breath and hypoxia. Diagnosed with COVID on 12/23. Pulmonary embolus is suspected with low to intermediate probability. EXAM: CT ANGIOGRAPHY CHEST WITH CONTRAST TECHNIQUE: Multidetector CT imaging of the chest was performed using the standard protocol during bolus administration of intravenous contrast. Multiplanar CT image reconstructions and MIPs were obtained to evaluate the vascular anatomy. CONTRAST:  137mL OMNIPAQUE IOHEXOL 350 MG/ML SOLN COMPARISON:  None. FINDINGS: Cardiovascular: Motion artifact limits examination. There is good opacification with moderately good visualization of the central and proximal segmental pulmonary arteries. No focal filling defects are demonstrated. No evidence of significant central pulmonary embolus. Smaller peripheral emboli could be obscured. Normal heart size. No pericardial effusions. Normal caliber thoracic aorta. No evidence of significant aortic dissection. Great vessel origins are patent. Mediastinum/Nodes: Esophagus is decompressed. Scattered mediastinal lymph nodes are not pathologically enlarged. Lungs/Pleura:  Patchy nodular airspace disease in a mostly peripheral distribution in the lungs bilaterally. Changes are consistent with COVID pneumonia. No pleural effusions. No pneumothorax. Airways are patent. Upper Abdomen: No acute abnormalities demonstrated in the visualized upper abdomen. Musculoskeletal: Degenerative changes in the spine. Review of the MIP images confirms the above findings. IMPRESSION: 1. No evidence of significant central pulmonary embolus. 2. Patchy nodular airspace disease in the lungs bilaterally consistent with COVID pneumonia. Electronically Signed   By: Lucienne Capers M.D.   On: 09/26/2020 22:56   DG Chest Port 1 View  Result Date: 09/26/2020 CLINICAL DATA:  COVID-19 diagnosed 09/19/2020, increasing shortness of breath EXAM: PORTABLE CHEST 1 VIEW COMPARISON:  03/19/2016 FINDINGS: Single frontal view of the chest demonstrates patchy bilateral ground-glass airspace disease greatest at the lung bases. No effusion or pneumothorax. Cardiac silhouette is unremarkable. No acute bony abnormalities. IMPRESSION: 1. Multifocal bilateral pneumonia compatible with COVID 19. Electronically Signed   By: Sharlet Salina M.D.   On: 09/26/2020 16:31        Scheduled Meds: . enoxaparin (LOVENOX) injection  40 mg Subcutaneous Q24H  . methylPREDNISolone (SOLU-MEDROL) injection  1 mg/kg Intravenous Q12H   Followed by  . [START ON 09/30/2020] predniSONE  50 mg Oral Daily   Continuous Infusions: . lactated ringers Stopped (09/28/20 0855)  . remdesivir 100 mg in NS 100 mL 100 mg (09/28/20 0903)     LOS: 2 days   Alwyn Ren, MD 09/28/2020, 1:03 PM

## 2020-09-28 NOTE — ED Notes (Signed)
Pt provided hospital bed for comfort measures.

## 2020-09-29 LAB — CBC WITH DIFFERENTIAL/PLATELET
Abs Immature Granulocytes: 0.2 10*3/uL — ABNORMAL HIGH (ref 0.00–0.07)
Basophils Absolute: 0 10*3/uL (ref 0.0–0.1)
Basophils Relative: 0 %
Eosinophils Absolute: 0 10*3/uL (ref 0.0–0.5)
Eosinophils Relative: 0 %
HCT: 31.4 % — ABNORMAL LOW (ref 36.0–46.0)
Hemoglobin: 10.1 g/dL — ABNORMAL LOW (ref 12.0–15.0)
Lymphocytes Relative: 17 %
Lymphs Abs: 1.4 10*3/uL (ref 0.7–4.0)
MCH: 26 pg (ref 26.0–34.0)
MCHC: 32.2 g/dL (ref 30.0–36.0)
MCV: 80.7 fL (ref 80.0–100.0)
Metamyelocytes Relative: 1 %
Monocytes Absolute: 0.2 10*3/uL (ref 0.1–1.0)
Monocytes Relative: 3 %
Myelocytes: 1 %
Neutro Abs: 6.3 10*3/uL (ref 1.7–7.7)
Neutrophils Relative %: 78 %
Platelets: 379 10*3/uL (ref 150–400)
RBC: 3.89 MIL/uL (ref 3.87–5.11)
RDW: 16.5 % — ABNORMAL HIGH (ref 11.5–15.5)
WBC: 8.1 10*3/uL (ref 4.0–10.5)
nRBC: 0 % (ref 0.0–0.2)

## 2020-09-29 LAB — COMPREHENSIVE METABOLIC PANEL
ALT: 42 U/L (ref 0–44)
AST: 23 U/L (ref 15–41)
Albumin: 3.4 g/dL — ABNORMAL LOW (ref 3.5–5.0)
Alkaline Phosphatase: 103 U/L (ref 38–126)
Anion gap: 13 (ref 5–15)
BUN: 31 mg/dL — ABNORMAL HIGH (ref 6–20)
CO2: 19 mmol/L — ABNORMAL LOW (ref 22–32)
Calcium: 8.7 mg/dL — ABNORMAL LOW (ref 8.9–10.3)
Chloride: 109 mmol/L (ref 98–111)
Creatinine, Ser: 0.98 mg/dL (ref 0.44–1.00)
GFR, Estimated: >60 mL/min (ref >60)
Glucose, Bld: 220 mg/dL — ABNORMAL HIGH (ref 70–99)
Potassium: 3.9 mmol/L (ref 3.5–5.1)
Sodium: 141 mmol/L (ref 135–145)
Total Bilirubin: 0.4 mg/dL (ref 0.3–1.2)
Total Protein: 6.6 g/dL (ref 6.5–8.1)

## 2020-09-29 LAB — HEMOGLOBIN A1C
Hgb A1c MFr Bld: 5.2 % (ref 4.8–5.6)
Mean Plasma Glucose: 102.54 mg/dL

## 2020-09-29 LAB — CBG MONITORING, ED
Glucose-Capillary: 228 mg/dL — ABNORMAL HIGH (ref 70–99)
Glucose-Capillary: 217 mg/dL — ABNORMAL HIGH (ref 70–99)

## 2020-09-29 LAB — C-REACTIVE PROTEIN: CRP: 1 mg/dL — ABNORMAL HIGH (ref <1.0)

## 2020-09-29 LAB — D-DIMER, QUANTITATIVE: D-Dimer, Quant: <0.27 ug/mL-FEU (ref 0.00–0.50)

## 2020-09-29 LAB — FERRITIN: Ferritin: 296 ng/mL (ref 11–307)

## 2020-09-29 NOTE — ED Notes (Signed)
CBG 228 

## 2020-09-29 NOTE — Discharge Instructions (Signed)
You are scheduled for a Remdesivir infusion on 09/30/20 at 3PM. Please come to [address] [facility], you will see a COVID infusion banner by the road.  Enter there and turn left.  There are marked spaces for Infusion. Call the number on the sign or [phone] and someone will come out and bring you inside. If someone is driving you please come to the same area and call the number and someone will come outside to get you. Thank you!

## 2020-09-29 NOTE — Care Management (Signed)
ED CM received call from RN concerning portable oxygen not delivered as of yet.  CM will contact [employer] liaison Jermaine.

## 2020-09-29 NOTE — Progress Notes (Signed)
The patient is scheduled for a Remdesivir infusion on 1/3/2 at 3PM. Have the patient come to [address] [facility], they will see a COVID infusion banner by the road.  Enter there and turn left. There are marked spaces for Infusion.  Call the number on the sign or [phone] and someone will come out and bring them inside.  If someone is driving them, have them come to the same area and call the number and someone will come outside to get them. Thank you!

## 2020-09-29 NOTE — TOC Transition Note (Signed)
Transition of Care [facility]) - CM/SW Discharge Note   Patient Details  Name: Krystal Barber MRN: [id number] Date of Birth: [birth year]  Transition of Care [facility]) CM/SW Contact:  Lockie Pares, RN Phone Number: 09/29/2020, 4:37 PM   Clinical Narrative:      Oxygen  set up for patient 2LPM.  Via [employer], placed infusion instructions in patient information. Oxygen qualifiers in        Patient Goals and CMS Choice        Discharge Placement                       Discharge Plan and Services                DME Arranged: Oxygen DME Agency:  ([employer]) Date DME Agency Contacted: 09/29/20 Time DME Agency Contacted: [phone] Representative spoke with at DME Agency: Shaune Leeks            Social Determinants of Health (SDOH) Interventions     Readmission Risk Interventions No flowsheet data found.

## 2020-09-29 NOTE — Care Management (Signed)
Portable tank has been delivered to [facility] ED for transport home.

## 2020-09-29 NOTE — ED Notes (Addendum)
Resting O2- 94% RA O2 with ambulation- 83% RA 2L O2 with ambulation- 90%

## 2020-09-29 NOTE — Care Management (Signed)
Asked Rn for oxygen qualifiers to obtain home oxygen.

## 2020-09-30 ENCOUNTER — Ambulatory Visit (HOSPITAL_COMMUNITY)
Admission: RE | Admit: 2020-09-30 | Discharge: 2020-09-30 | Disposition: A | Discharge status: Home / Self Care | Payer: BLUE CROSS/BLUE SHIELD | Source: Ambulatory Visit | Attending: Pulmonary Disease | Admitting: Pulmonary Disease

## 2020-09-30 DIAGNOSIS — U071 COVID-19: Secondary | ICD-10-CM | POA: Insufficient documentation

## 2020-09-30 DIAGNOSIS — J1282 Pneumonia due to coronavirus disease 2019: Secondary | ICD-10-CM | POA: Diagnosis not present

## 2020-09-30 MED ORDER — SODIUM CHLORIDE 0.9 % IV SOLN: INTRAVENOUS | Status: DC | PRN

## 2020-09-30 MED ORDER — SODIUM CHLORIDE 0.9 % IV SOLN
100.0000 mg | Freq: Once | INTRAVENOUS | Status: AC
  Administered 2020-09-30: 100 mg via INTRAVENOUS

## 2020-09-30 MED ORDER — ALBUTEROL SULFATE HFA 108 (90 BASE) MCG/ACT IN AERS: 2.0000 | INHALATION_SPRAY | Freq: Once | RESPIRATORY_TRACT | Status: DC | PRN

## 2020-09-30 MED ORDER — DIPHENHYDRAMINE HCL 50 MG/ML IJ SOLN: 50.0000 mg | Freq: Once | INTRAMUSCULAR | Status: DC | PRN

## 2020-09-30 MED ORDER — METHYLPREDNISOLONE SODIUM SUCC 125 MG IJ SOLR: 125.0000 mg | Freq: Once | INTRAMUSCULAR | Status: DC | PRN

## 2020-09-30 MED ORDER — EPINEPHRINE 0.3 MG/0.3ML IJ SOAJ: 0.3000 mg | Freq: Once | INTRAMUSCULAR | Status: DC | PRN

## 2020-09-30 MED ORDER — FAMOTIDINE IN NACL 20-0.9 MG/50ML-% IV SOLN: 20.0000 mg | Freq: Once | INTRAVENOUS | Status: DC | PRN

## 2020-09-30 NOTE — Progress Notes (Signed)
  Diagnosis: COVID-19  Physician: Dr. Wright   Procedure: [facility] Med: remdesivir infusion - Provided patient with remdesivir fact sheet for patients, parents and caregivers prior to infusion.  Complications: No immediate complications noted.  Discharge: Discharged home   Jaquel Coomer  B Destaney Sarkis 09/30/2020   

## 2020-09-30 NOTE — Discharge Instructions (Signed)
If you have any questions or concerns after the infusion please call the Advanced Practice Provider on call at [phone]. This number is ONLY intended for your use regarding questions or concerns about the infusion post-treatment side-effects.  Please do not provide this number to others for use. For return to work notes please contact your primary care provider.   If someone you know is interested in receiving treatment please have them call the COVID hotline at [phone].    

## 2020-10-01 LAB — CULTURE, BLOOD (ROUTINE X 2)
Culture: NO GROWTH
Special Requests: ADEQUATE

## 2020-10-02 NOTE — Discharge Summary (Signed)
[facility] Discharge Summary   Patient: Krystal Barber [id number]  PCP: Patient, No Pcp Per  Date of admission: 09/26/2020   Date of discharge: 09/29/2020     Discharge Diagnoses:  Principal Problem:   Pneumonia due to COVID-19 virus Active Problems:   Hypoxia   Admitted From: home Disposition:  Home   Recommendations for Outpatient Follow-up:  1. PCP: follow up with PCP in 1 week 2. Follow up LABS/TEST:  none   Follow-up Information    PCP. Schedule an appointment as soon as possible for a visit in 1 week(s).        [facility] Follow up.   Specialty: Internal Medicine Why:  patient is scheduled for a Remdesivir infusion on 1/3/2 at 3PM. Have the patient come to [address] [facility], they will see a COVID infusion banner by the road.  Enter there and turn left. There are marked spaces for Infusion.  Call the number on the sign Contact information: [address] [place] [address]               Diet recommendation: Cardiac diet  Activity: The patient is advised to gradually reintroduce usual activities, as tolerated  Discharge Condition: stable  Code Status: Full code   History of present illness: As per the H and P dictated on admission, "Krystal Barber is a 55 y.o. female with medical history significant for anemia and GERD who presents  with complaint of shortness of breath. Pt reports 12 days ago she began having cold like symptoms and she took a home COVID test which was positive. Her children were also having similar symptoms and tested positive for COVID. She states that since then her symptoms have worsened and she is now having shortness of breath. She also complains of diarrhea, fevers, lack of appetite. She has been taking OTC medications without relief. States that she called EMS 3 days ago for shortness of breath and had an O2 sat in the low 90s and at that time she did not want to be brought to  the hospital so she stayed home.  Has become more she states that has been checking her oxygen since then and it was as low as 88% today prompting her to come to the hospital for evaluation.. Pt is unvaccinated.  Not go see her PCP after her positive Covid test and she did not receive monoclonal antibodies "  Hospital Course:  Summary of her active problems in the hospital is as following.  acute hypoxic respiratory failure secondary to Covid pneumonia-present on admission. She was saturating 88% on room air prior to admission. CRP 1.1 from 1.9 Lactic acid 1.6 Pro-Cal less than 0.10 White count 4.7 Normal renal functions D-dimer is 0.52 from 13.13 Patient has been started on remdesivir continue and finish the course Treated with IV steroids Incentive spirometer Encourage patient to use incentive spirometer out of bed as tolerated She is saturating 91% on 2 L.  Obesity: Placing the pt at higher risk of poor outcome.   Body mass index is 38.23 kg/m.   Patient was ambulatory without any assistance. Consultants: none Procedures: none  DISCHARGE MEDICATION: Allergies as of 09/29/2020      Reactions   Acetaminophen    SEIZURE LIKE SX'S   Benzonatate [tessalon Perles]    Chills, shaking, SOB, chest tightness      Medication List    ASK your doctor about these medications   dextromethorphan-guaiFENesin  30-600 MG 12hr tablet Commonly known as: MUCINEX DM Take 1 tablet by mouth 2 (two) times daily as needed for cough.   guaifenesin 100 MG/5ML syrup Commonly known as: ROBITUSSIN Take 200 mg by mouth 3 (three) times daily as needed for cough or congestion.   ibuprofen 200 MG tablet Commonly known as: ADVIL Take 200 mg by mouth every 6 (six) hours as needed for headache or fever.   Vitamin D (Ergocalciferol) 1.25 MG (50000 UNIT) Caps capsule Commonly known as: DRISDOL Take 1 capsule (50,000 Units total) by mouth every 7 (seven) days.       Discharge Exam: Filed  Weights   09/26/20 1311  Weight: 99.5 kg   Vitals:   09/29/20 1301 09/29/20 1624  BP: (!) 142/68 121/69  Pulse: 71 62  Resp: 18 16  Temp:    SpO2: 94% 94%   General: Appear in no distress, no Rash; Oral Mucosa Clear, moist. no Abnormal Neck Mass Or lumps, Conjunctiva normal  Cardiovascular: S1 and S2 Present, no Murmur Respiratory: good respiratory effort, Bilateral Air entry present and bilateral  Crackles, no wheezes Abdomen: Bowel Sound present, Soft and no tenderness Extremities: no Pedal edema Neurology: alert and oriented to time, place, and person affect appropriate. no new focal deficit  The results of significant diagnostics from this hospitalization (including imaging, microbiology, ancillary and laboratory) are listed below for reference.    Significant Diagnostic Studies: CT ANGIO CHEST PE W OR WO CONTRAST  Result Date: 09/26/2020 CLINICAL DATA:  Positive D-dimer. Shortness of breath and hypoxia. Diagnosed with COVID on 12/23. Pulmonary embolus is suspected with low to intermediate probability. EXAM: CT ANGIOGRAPHY CHEST WITH CONTRAST TECHNIQUE: Multidetector CT imaging of the chest was performed using the standard protocol during bolus administration of intravenous contrast. Multiplanar CT image reconstructions and MIPs were obtained to evaluate the vascular anatomy. CONTRAST:  154mL OMNIPAQUE IOHEXOL 350 MG/ML SOLN COMPARISON:  None. FINDINGS: Cardiovascular: Motion artifact limits examination. There is good opacification with moderately good visualization of the central and proximal segmental pulmonary arteries. No focal filling defects are demonstrated. No evidence of significant central pulmonary embolus. Smaller peripheral emboli could be obscured. Normal heart size. No pericardial effusions. Normal caliber thoracic aorta. No evidence of significant aortic dissection. Great vessel origins are patent. Mediastinum/Nodes: Esophagus is decompressed. Scattered mediastinal lymph  nodes are not pathologically enlarged. Lungs/Pleura: Patchy nodular airspace disease in a mostly peripheral distribution in the lungs bilaterally. Changes are consistent with COVID pneumonia. No pleural effusions. No pneumothorax. Airways are patent. Upper Abdomen: No acute abnormalities demonstrated in the visualized upper abdomen. Musculoskeletal: Degenerative changes in the spine. Review of the MIP images confirms the above findings. IMPRESSION: 1. No evidence of significant central pulmonary embolus. 2. Patchy nodular airspace disease in the lungs bilaterally consistent with COVID pneumonia. Electronically Signed   By: Lucienne Capers M.D.   On: 09/26/2020 22:56   DG Chest Port 1 View  Result Date: 09/26/2020 CLINICAL DATA:  COVID-19 diagnosed 09/19/2020, increasing shortness of breath EXAM: PORTABLE CHEST 1 VIEW COMPARISON:  03/19/2016 FINDINGS: Single frontal view of the chest demonstrates patchy bilateral ground-glass airspace disease greatest at the lung bases. No effusion or pneumothorax. Cardiac silhouette is unremarkable. No acute bony abnormalities. IMPRESSION: 1. Multifocal bilateral pneumonia compatible with COVID 19. Electronically Signed   By: Randa Ngo M.D.   On: 09/26/2020 16:31    Microbiology: Recent Results (from the past 240 hour(s))  Resp Panel by RT-PCR (Flu A&B, Covid) Nasopharyngeal Swab  Status: Abnormal   Collection Time: 09/26/20  6:14 PM   Specimen: Nasopharyngeal Swab; Nasopharyngeal(NP) swabs in vial transport medium  Result Value Ref Range Status   SARS Coronavirus 2 by RT PCR POSITIVE (A) NEGATIVE Final    Comment: RESULT CALLED TO, READ BACK BY AND VERIFIED WITH: M.VENTER, PA AT 2014 ON 09/26/20 BY N.THOMPSON (NOTE) SARS-CoV-2 target nucleic acids are DETECTED.  The SARS-CoV-2 RNA is generally detectable in upper respiratory specimens during the acute phase of infection. Positive results are indicative of the presence of the identified virus, but do  not rule out bacterial infection or co-infection with other pathogens not detected by the test. Clinical correlation with patient history and other diagnostic information is necessary to determine patient infection status. The expected result is Negative.  Fact Sheet for Patients: BloggerCourse.com  Fact Sheet for Healthcare Providers: SeriousBroker.it  This test is not yet approved or cleared by the [place] FDA and  has been authorized for detection and/or diagnosis of SARS-CoV-2 by FDA under an Emergency Use Authorization (EUA).  This EUA will remain in effect (meaning thi s test can be used) for the duration of  the COVID-19 declaration under Section 564(b)(1) of the Act, 21 U.S.C. section 360bbb-3(b)(1), unless the authorization is terminated or revoked sooner.     Influenza A by PCR NEGATIVE NEGATIVE Final   Influenza B by PCR NEGATIVE NEGATIVE Final    Comment: (NOTE) The Xpert Xpress SARS-CoV-2/FLU/RSV plus assay is intended as an aid in the diagnosis of influenza from Nasopharyngeal swab specimens and should not be used as a sole basis for treatment. Nasal washings and aspirates are unacceptable for Xpert Xpress SARS-CoV-2/FLU/RSV testing.  Fact Sheet for Patients: BloggerCourse.com  Fact Sheet for Healthcare Providers: SeriousBroker.it  This test is not yet approved or cleared by the [place] FDA and has been authorized for detection and/or diagnosis of SARS-CoV-2 by FDA under an Emergency Use Authorization (EUA). This EUA will remain in effect (meaning this test can be used) for the duration of the COVID-19 declaration under Section 564(b)(1) of the Act, 21 U.S.C. section 360bbb-3(b)(1), unless the authorization is terminated or revoked.  Performed at [facility], [address], [place], [place] [address]   Blood Culture (routine x  2)     Status: None   Collection Time: 09/26/20  6:30 PM   Specimen: BLOOD  Result Value Ref Range Status   Specimen Description   Final    BLOOD RIGHT ARM Performed at [facility], [address], [place], [place] [address]    Special Requests   Final    BOTTLES DRAWN AEROBIC AND ANAEROBIC Blood Culture adequate volume Performed at [facility], [address] [place], [place] [address]    Culture   Final    NO GROWTH 5 DAYS Performed at [facility], [address], [place], [place] [address]    Report Status 10/01/2020 FINAL  Final     Labs: CBC: Recent Labs  Lab 09/26/20 1601 09/27/20 0525 09/28/20 0410 09/29/20 0415  WBC 5.3 2.7* 4.7 8.1  NEUTROABS 3.2  --  3.4 6.3  HGB 11.5* 10.6* 10.2* 10.1*  HCT 36.8 32.7* 32.6* 31.4*  MCV 81.4 80.0 81.5 80.7  PLT 317 287 335 379   Basic Metabolic Panel: Recent Labs  Lab 09/26/20 1601 09/27/20 0525 09/28/20 0410 09/29/20 0415  NA 139  --  140 141  K 3.9  --  3.8 3.9  CL  105  --  108 109  CO2 19*  --  21* 19*  GLUCOSE 108*  --  218* 220*  BUN 20  --  24* 31*  CREATININE 1.03* 0.87 0.88 0.98  CALCIUM 9.0  --  9.1 8.7*   Liver Function Tests: Recent Labs  Lab 09/26/20 1601 09/28/20 0410 09/29/20 0415  AST 103* 33 23  ALT 67* 53* 42  ALKPHOS 139* 116 103  BILITOT 1.3* 0.4 0.4  PROT 8.0 6.9 6.6  ALBUMIN 4.1 3.6 3.4*   CBG: Recent Labs  Lab 09/28/20 1403 09/28/20 1705 09/28/20 2343 09/29/20 0756 09/29/20 1221  GLUCAP 197* 225* 210* 228* 217*    Time spent: 35 minutes  Signed:  Lynden Oxford  [facility] 09/29/2020 9:04 AM

## 2020-11-06 NOTE — Addendum Note (Signed)
Encounter addended by: Marilynn Rail, RN on: 11/06/2020 6:39 PM  Actions taken: Charge Capture section accepted

## 2021-10-13 IMAGING — CT CT ANGIO CHEST
2 of 6 series · 18 of 36 positions shown · IV contrast (omnipaque)
Comparison: None.

CLINICAL DATA: Positive D-dimer. Shortness of breath and hypoxia.
Diagnosed with COVID on [DATE]. Pulmonary embolus is suspected with
low to intermediate probability.

EXAM:
CT ANGIOGRAPHY CHEST WITH CONTRAST
TECHNIQUE: Multidetector CT imaging of the chest was performed using the
standard protocol during bolus administration of intravenous
contrast. Multiplanar CT image reconstructions and MIPs were
obtained to evaluate the vascular anatomy.
CONTRAST:  100mL OMNIPAQUE IOHEXOL 350 MG/ML SOLN

[Series 5: thins · axial · 0.73mm/px · z∈[+1555,+1795]mm · 17 of 270 slices shown]
[im 15/270  lung]
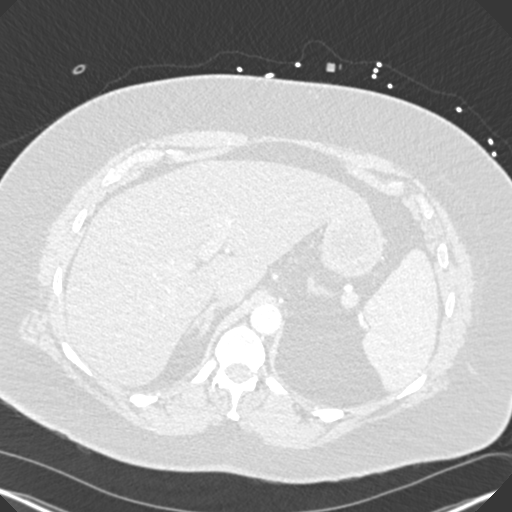
[im 30/270  mediastinal]
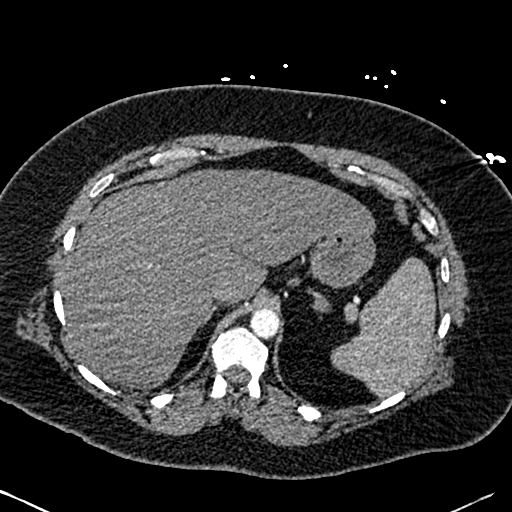
[im 45/270  lung]
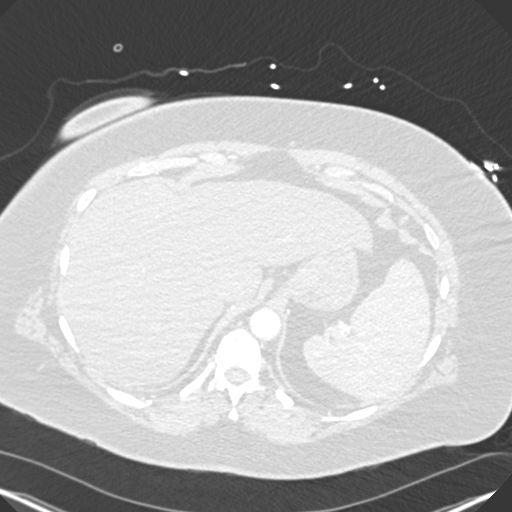
[im 60/270  mediastinal]
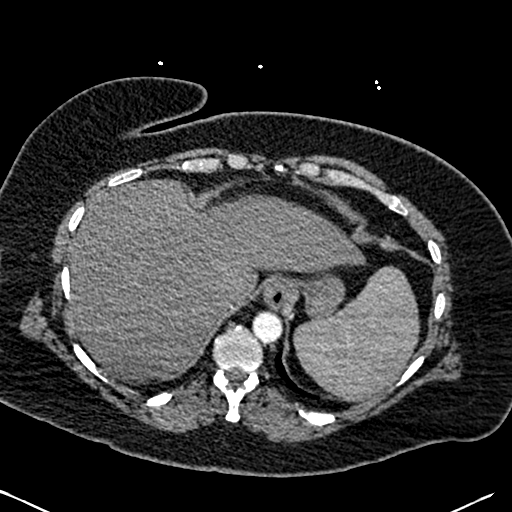
[im 75/270  lung]
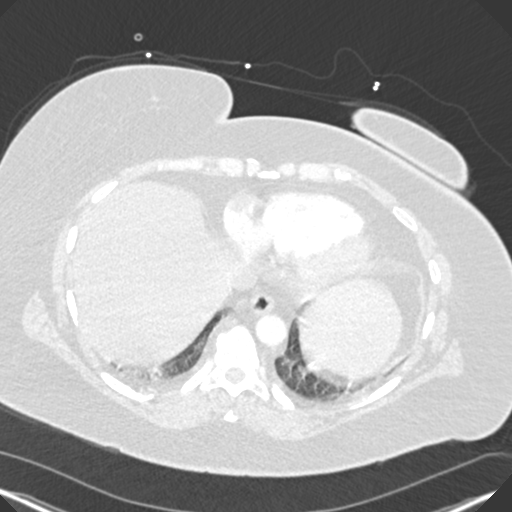
[im 90/270  mediastinal]
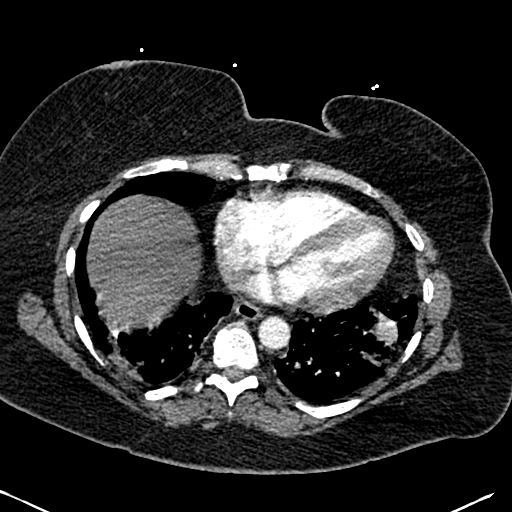
[im 105/270  lung]
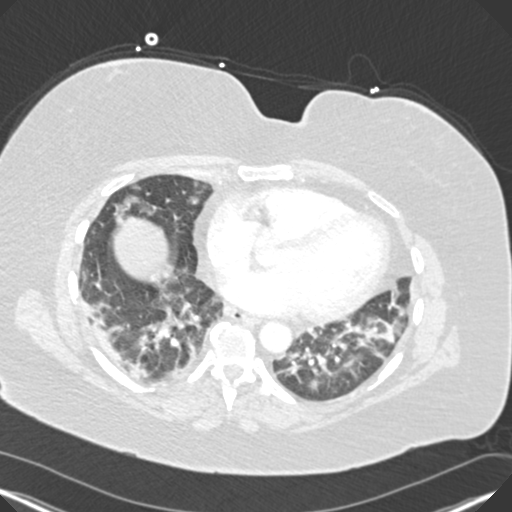
[im 120/270  mediastinal]
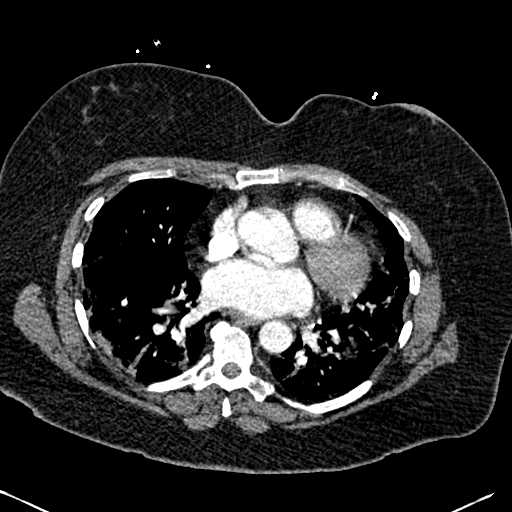
[im 135/270  lung]
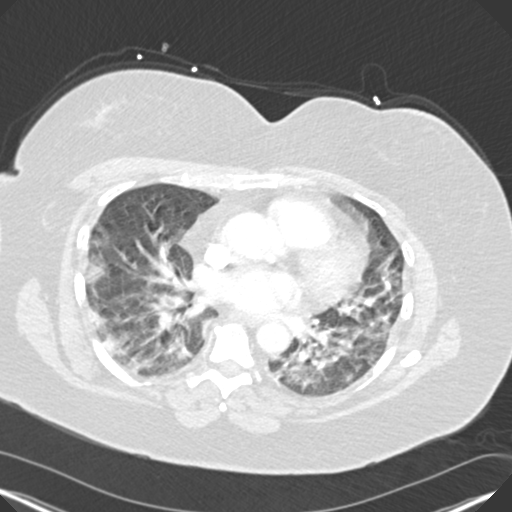
[im 150/270  mediastinal]
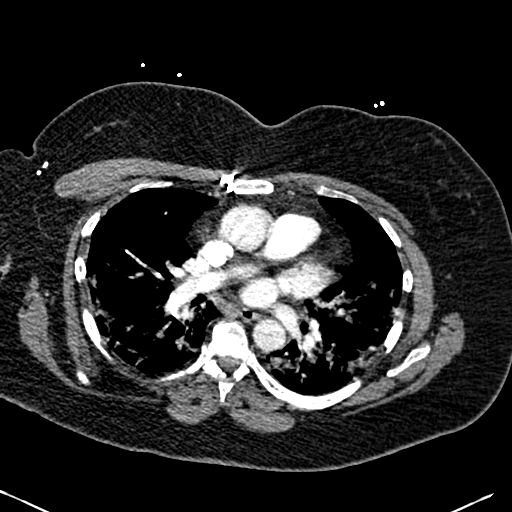
[im 165/270  lung]
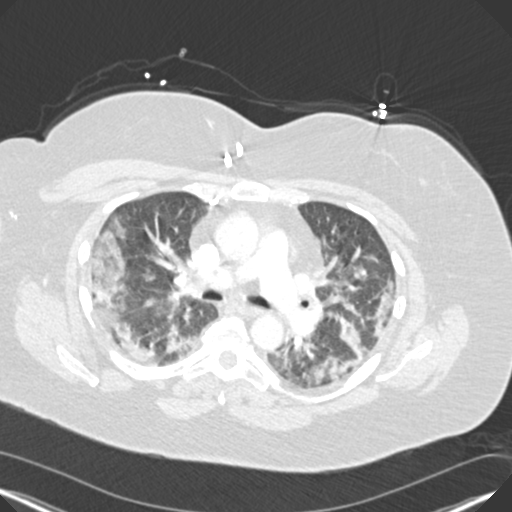
[im 180/270  mediastinal]
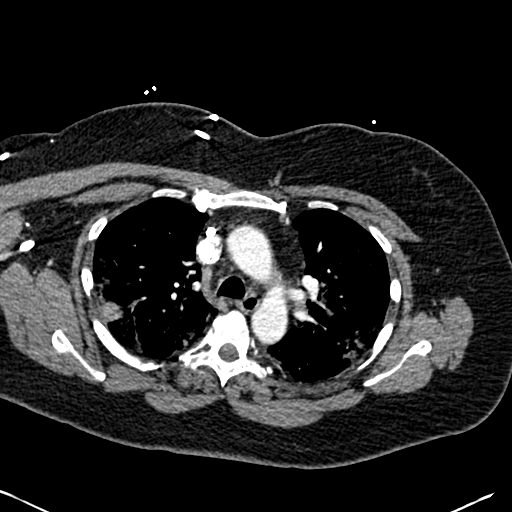
[im 195/270  lung]
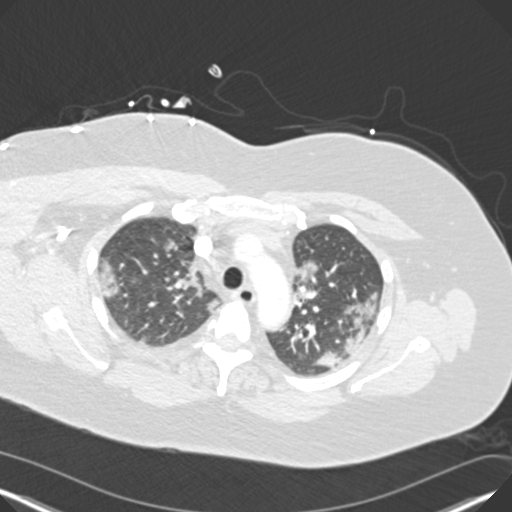
[im 210/270  mediastinal]
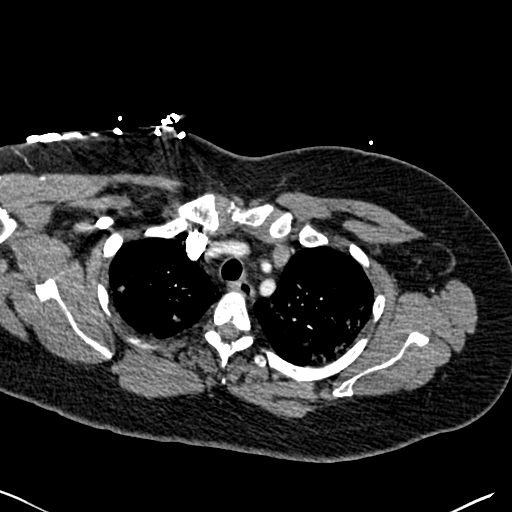
[im 225/270  lung]
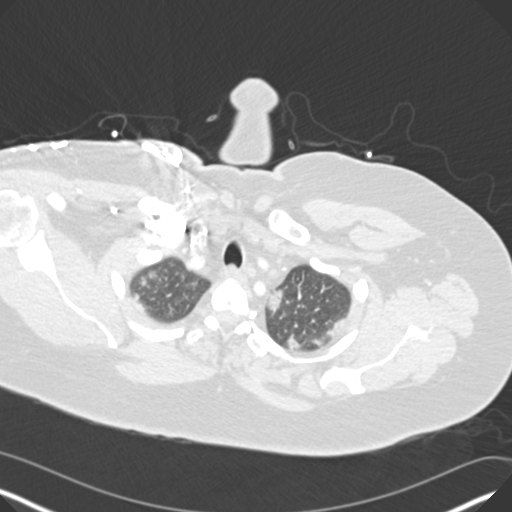
[im 240/270  mediastinal]
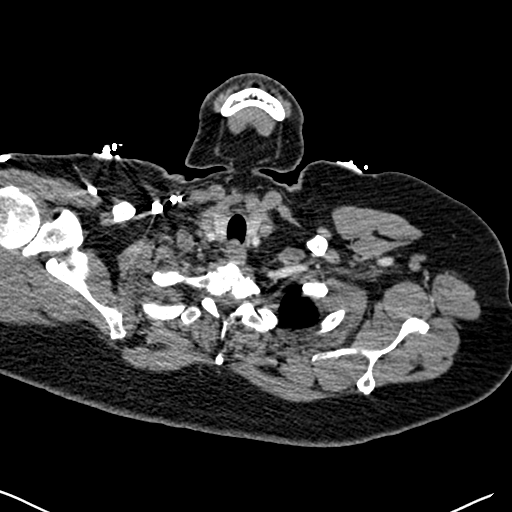
[im 255/270  lung]
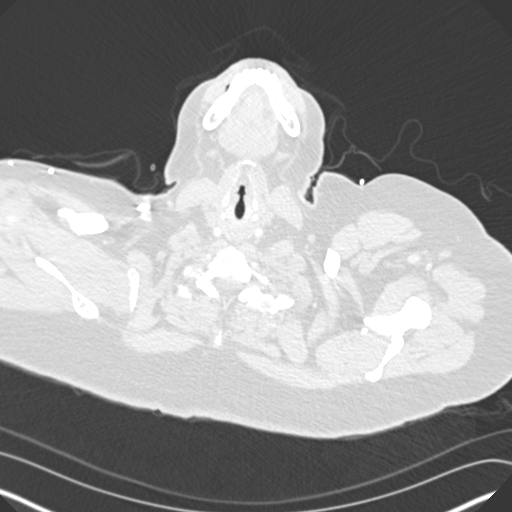

[Series 7: coronal mpr · coronal · 0.53mm/px · 1 of 142 slices shown]
[im 71/142  mediastinal]
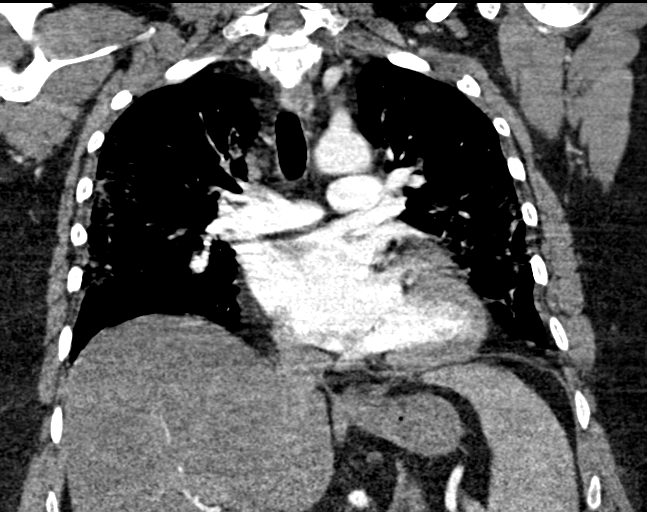

[18 of 36 positions shown; findings below may reference images not displayed]

FINDINGS: Cardiovascular: Motion artifact limits examination. There is good
opacification with moderately good visualization of the central and
proximal segmental pulmonary arteries. No focal filling defects are
demonstrated. No evidence of significant central pulmonary embolus.
Smaller peripheral emboli could be obscured. Normal heart size. No
pericardial effusions. Normal caliber thoracic aorta. No evidence of
significant aortic dissection. Great vessel origins are patent.

Mediastinum/Nodes: Esophagus is decompressed. Scattered mediastinal
lymph nodes are not pathologically enlarged.

Lungs/Pleura: Patchy nodular airspace disease in a mostly peripheral
distribution in the lungs bilaterally. Changes are consistent with
COVID pneumonia. No pleural effusions. No pneumothorax. Airways are
patent.

Upper Abdomen: No acute abnormalities demonstrated in the visualized
upper abdomen.

Musculoskeletal: Degenerative changes in the spine.

Review of the MIP images confirms the above findings.
IMPRESSION: 1. No evidence of significant central pulmonary embolus.
2. Patchy nodular airspace disease in the lungs bilaterally
consistent with COVID pneumonia.

## 2021-11-20 ENCOUNTER — Encounter: Payer: Managed Care, Other (non HMO) | Admitting: Radiology
